# Patient Record
Sex: Female | Born: 1935 | Race: White | Hispanic: No | Marital: Married | Smoking: Never smoker
Health system: Southern US, Community
[De-identification: ages and names within clinical notes are randomized; demographics above are authoritative.]

## PROBLEM LIST (undated history)

## (undated) DIAGNOSIS — E785 Hyperlipidemia, unspecified: Secondary | ICD-10-CM

## (undated) DIAGNOSIS — M8580 Other specified disorders of bone density and structure, unspecified site: Secondary | ICD-10-CM

## (undated) DIAGNOSIS — F330 Major depressive disorder, recurrent, mild: Secondary | ICD-10-CM

## (undated) DIAGNOSIS — R42 Dizziness and giddiness: Secondary | ICD-10-CM

## (undated) DIAGNOSIS — A0472 Enterocolitis due to Clostridium difficile, not specified as recurrent: Secondary | ICD-10-CM

## (undated) DIAGNOSIS — C259 Malignant neoplasm of pancreas, unspecified: Secondary | ICD-10-CM

## (undated) DIAGNOSIS — E039 Hypothyroidism, unspecified: Secondary | ICD-10-CM

## (undated) DIAGNOSIS — I358 Other nonrheumatic aortic valve disorders: Secondary | ICD-10-CM

## (undated) HISTORY — DX: Enterocolitis due to Clostridium difficile, not specified as recurrent: A04.72

## (undated) HISTORY — DX: Malignant neoplasm of pancreas, unspecified: C25.9

## (undated) HISTORY — PX: OTHER SURGICAL HISTORY: SHX169

## (undated) HISTORY — DX: Major depressive disorder, recurrent, mild: F33.0

## (undated) HISTORY — DX: Other nonrheumatic aortic valve disorders: I35.8

## (undated) HISTORY — DX: Hyperlipidemia, unspecified: E78.5

## (undated) HISTORY — DX: Hypothyroidism, unspecified: E03.9

## (undated) HISTORY — DX: Dizziness and giddiness: R42

## (undated) HISTORY — DX: Other specified disorders of bone density and structure, unspecified site: M85.80

---
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
## 1971-02-01 HISTORY — PX: PARTIAL HYSTERECTOMY: SHX80

## 1990-01-31 HISTORY — PX: CHOLECYSTECTOMY: SHX55

## 1997-01-31 HISTORY — PX: BLADDER SUSPENSION: SHX72

## 1997-05-13 ENCOUNTER — Ambulatory Visit (HOSPITAL_COMMUNITY): Admission: RE | Admit: 1997-05-13 | Discharge: 1997-05-13 | Payer: Self-pay | Admitting: *Deleted

## 1998-05-28 ENCOUNTER — Ambulatory Visit (HOSPITAL_COMMUNITY): Admission: RE | Admit: 1998-05-28 | Discharge: 1998-05-28 | Payer: Self-pay | Admitting: *Deleted

## 1999-06-11 ENCOUNTER — Ambulatory Visit (HOSPITAL_COMMUNITY): Admission: RE | Admit: 1999-06-11 | Discharge: 1999-06-11 | Payer: Self-pay | Admitting: *Deleted

## 1999-10-01 ENCOUNTER — Encounter: Admission: RE | Admit: 1999-10-01 | Discharge: 1999-10-01 | Payer: Self-pay | Admitting: *Deleted

## 2000-05-25 ENCOUNTER — Encounter: Admission: RE | Admit: 2000-05-25 | Discharge: 2000-05-25 | Payer: Self-pay | Admitting: *Deleted

## 2000-08-18 ENCOUNTER — Other Ambulatory Visit: Admission: RE | Admit: 2000-08-18 | Discharge: 2000-08-18 | Payer: Self-pay | Admitting: *Deleted

## 2001-04-25 ENCOUNTER — Encounter: Admission: RE | Admit: 2001-04-25 | Discharge: 2001-04-25 | Payer: Self-pay | Admitting: *Deleted

## 2001-09-26 ENCOUNTER — Ambulatory Visit (HOSPITAL_COMMUNITY): Admission: RE | Admit: 2001-09-26 | Discharge: 2001-09-26 | Payer: Self-pay | Admitting: Gastroenterology

## 2001-12-05 ENCOUNTER — Encounter: Admission: RE | Admit: 2001-12-05 | Discharge: 2001-12-05 | Payer: Self-pay | Admitting: *Deleted

## 2002-05-23 ENCOUNTER — Encounter: Admission: RE | Admit: 2002-05-23 | Discharge: 2002-05-23 | Payer: Self-pay | Admitting: *Deleted

## 2003-01-21 ENCOUNTER — Encounter: Admission: RE | Admit: 2003-01-21 | Discharge: 2003-01-21 | Payer: Self-pay | Admitting: Neurological Surgery

## 2003-01-23 ENCOUNTER — Encounter: Admission: RE | Admit: 2003-01-23 | Discharge: 2003-01-23 | Payer: Self-pay | Admitting: Neurological Surgery

## 2003-03-25 ENCOUNTER — Encounter: Admission: RE | Admit: 2003-03-25 | Discharge: 2003-03-25 | Payer: Self-pay | Admitting: *Deleted

## 2004-02-01 HISTORY — PX: WHIPPLE PROCEDURE: SHX2667

## 2004-03-25 ENCOUNTER — Encounter: Admission: RE | Admit: 2004-03-25 | Discharge: 2004-03-25 | Payer: Self-pay | Admitting: *Deleted

## 2004-07-04 ENCOUNTER — Emergency Department (HOSPITAL_COMMUNITY): Admission: EM | Admit: 2004-07-04 | Discharge: 2004-07-04 | Payer: Self-pay | Admitting: Emergency Medicine

## 2004-07-07 ENCOUNTER — Emergency Department (HOSPITAL_COMMUNITY): Admission: EM | Admit: 2004-07-07 | Discharge: 2004-07-07 | Payer: Self-pay | Admitting: Plastic Surgery

## 2004-07-10 ENCOUNTER — Ambulatory Visit (HOSPITAL_COMMUNITY): Admission: RE | Admit: 2004-07-10 | Discharge: 2004-07-10 | Payer: Self-pay | Admitting: *Deleted

## 2004-08-01 ENCOUNTER — Inpatient Hospital Stay (HOSPITAL_COMMUNITY): Admission: EM | Admit: 2004-08-01 | Discharge: 2004-08-03 | Payer: Self-pay | Admitting: Emergency Medicine

## 2004-08-02 ENCOUNTER — Encounter (INDEPENDENT_AMBULATORY_CARE_PROVIDER_SITE_OTHER): Payer: Self-pay | Admitting: *Deleted

## 2005-06-07 ENCOUNTER — Encounter: Admission: RE | Admit: 2005-06-07 | Discharge: 2005-06-07 | Payer: Self-pay | Admitting: *Deleted

## 2005-12-16 ENCOUNTER — Encounter: Admission: RE | Admit: 2005-12-16 | Discharge: 2005-12-16 | Payer: Self-pay | Admitting: Cardiology

## 2006-05-02 ENCOUNTER — Encounter: Admission: RE | Admit: 2006-05-02 | Discharge: 2006-05-02 | Payer: Self-pay | Admitting: Neurological Surgery

## 2006-06-27 ENCOUNTER — Encounter: Admission: RE | Admit: 2006-06-27 | Discharge: 2006-06-27 | Payer: Self-pay | Admitting: Obstetrics and Gynecology

## 2007-06-21 ENCOUNTER — Encounter: Admission: RE | Admit: 2007-06-21 | Discharge: 2007-06-21 | Payer: Self-pay | Admitting: Obstetrics and Gynecology

## 2008-06-12 ENCOUNTER — Encounter: Admission: RE | Admit: 2008-06-12 | Discharge: 2008-06-12 | Payer: Self-pay | Admitting: Family Medicine

## 2008-06-26 ENCOUNTER — Encounter: Admission: RE | Admit: 2008-06-26 | Discharge: 2008-06-26 | Payer: Self-pay | Admitting: Obstetrics and Gynecology

## 2008-07-02 ENCOUNTER — Ambulatory Visit: Payer: Self-pay | Admitting: Oncology

## 2008-09-25 ENCOUNTER — Ambulatory Visit: Payer: Self-pay | Admitting: Oncology

## 2008-11-06 ENCOUNTER — Ambulatory Visit: Payer: Self-pay | Admitting: Oncology

## 2008-12-18 ENCOUNTER — Ambulatory Visit: Payer: Self-pay | Admitting: Oncology

## 2009-01-31 HISTORY — PX: SHOULDER ARTHROSCOPY: SHX128

## 2009-01-31 HISTORY — PX: CATARACT EXTRACTION: SUR2

## 2009-02-02 ENCOUNTER — Ambulatory Visit: Payer: Self-pay | Admitting: Oncology

## 2009-04-14 ENCOUNTER — Encounter: Admission: RE | Admit: 2009-04-14 | Discharge: 2009-04-14 | Payer: Self-pay | Admitting: Neurological Surgery

## 2009-04-27 ENCOUNTER — Ambulatory Visit: Payer: Self-pay | Admitting: Oncology

## 2009-05-04 ENCOUNTER — Ambulatory Visit (HOSPITAL_BASED_OUTPATIENT_CLINIC_OR_DEPARTMENT_OTHER): Admission: RE | Admit: 2009-05-04 | Discharge: 2009-05-04 | Payer: Self-pay | Admitting: Orthopedic Surgery

## 2009-06-10 ENCOUNTER — Ambulatory Visit: Payer: Self-pay | Admitting: Oncology

## 2009-07-13 ENCOUNTER — Encounter: Admission: RE | Admit: 2009-07-13 | Discharge: 2009-07-13 | Payer: Self-pay | Admitting: Internal Medicine

## 2009-08-24 ENCOUNTER — Ambulatory Visit: Payer: Self-pay | Admitting: Oncology

## 2009-10-13 ENCOUNTER — Ambulatory Visit: Payer: Self-pay | Admitting: Oncology

## 2009-11-24 ENCOUNTER — Ambulatory Visit: Payer: Self-pay | Admitting: Oncology

## 2010-01-05 ENCOUNTER — Ambulatory Visit: Payer: Self-pay | Admitting: Oncology

## 2010-02-17 ENCOUNTER — Ambulatory Visit: Payer: Self-pay | Admitting: Oncology

## 2010-04-19 ENCOUNTER — Other Ambulatory Visit: Payer: Self-pay | Admitting: Geriatric Medicine

## 2010-04-19 ENCOUNTER — Other Ambulatory Visit: Payer: Self-pay | Admitting: Family Medicine

## 2010-04-19 DIAGNOSIS — Z1231 Encounter for screening mammogram for malignant neoplasm of breast: Secondary | ICD-10-CM

## 2010-04-19 DIAGNOSIS — M899 Disorder of bone, unspecified: Secondary | ICD-10-CM

## 2010-06-18 NOTE — H&P (Signed)
NAME:  [name], [name] NO.:  [id number]   MEDICAL RECORD NO.:  [id number]           PATIENT TYPE:   LOCATION:                                 FACILITY:   PHYSICIAN:  Petra Kuba, M.D.         DATE OF BIRTH:   DATE OF ADMISSION:  DATE OF DISCHARGE:                                HISTORY & PHYSICAL   HISTORY:  The patient's daughter called me 3 times today to discuss her  case.  Initially, she was going to go to the walk-in clinic.  She had  increasing right upper quadrant abdominal pain radiating to her back.  When  her pain got better, responding to some gas treatment, they thought she  would hold off.  They did not want to go to the emergency room, but then she  called me back with some near syncope and increasing fever, and I again  suggested that they go get checked out, and after multiple conversations on  the phone, we elected to see her in the emergency room, which she finally  agreed upon.  She had been evaluated at [facility] for probably  pancreatic cancer, had an endoscopic ultrasound, ERCP, and stent placement,  and then went to [facility] for a second opinion who thought probably she needed a  second biopsy before proceeding with chemotherapy and radiation prior to her  surgery, which has been suggested to her.   PAST MEDICAL HISTORY:  1.  Pertinent for some thyroid problems.  2.  A history of a partial hysterectomy.  3.  Cholecystectomy.  4.  Some back operations.   FAMILY HISTORY:  Negative for any obvious GI problems, except for her mother  having diverticulosis.   SOCIAL HISTORY:  Drinks socially.  Does not smoke.   ALLERGIES:  1.  PREDNISONE.  2.  LATEX.   REVIEW OF SYSTEMS:  Pertinent for increased fatigue and malaise over the  last few months; however, her yellow jaundice had resolved with a stent.  She has continued to have back pain throughout the years.  Initially, her  current problems have been blamed on her old back  problem.   PHYSICAL EXAMINATION:  GENERAL:  In no acute distress.  Answering questions  appropriately.  No obvious jaundice.  Did have a fever at home.  VITAL SIGNS:  See chart.  Sclerae are anicteric.  NECK:  Supple without obvious adenopathy.  LUNGS:  Clear.  HEART:  Regular rate and rhythm.  ABDOMEN:  Soft, nontender, good bowel sounds.  Cannot duplicate the pain.  No pedal edema.  Good peripheral pulses.   LABORATORIES AND X-RAYS:  Pending at the time of dictation.   ASSESSMENT:  1.  Probable pancreatic cancer status post stent at [facility], now      with increasing pain and fever, possibly stent migraine, stent      occlusion, cholangitis.  2.  History of thyroid problems.  3.  History of hysterectomy, cholecystectomy, and back surgery.   PLAN:  Get stat laboratories and x-rays and blood cultures.  Begin Cipro and  Flagyl.  Try to keep her comfortable with morphine, Phenergan, and Tylenol.  We have discussed ERCP and stent change and the probable diagnosis.  Will  proceed either later today or tomorrow if need be.  The risks, benefits, and  methods were discussed with the family, and they have thanked me for my pain  and quick actions.           ______________________________  Petra Kuba, M.D.     MEM/MEDQ  D:  08/01/2004  T:  08/01/2004  Job:  [id number]   cc:   Bernette Redbird, M.D.  [address]  [place], [place] [address]  Fax: [phone]

## 2010-06-18 NOTE — Op Note (Signed)
   [name], HOLLAR                          ACCOUNT NO.:  [id number]   MEDICAL RECORD NO.:  [id number]                   PATIENT TYPE:  AMB   LOCATION:  ENDO                                 FACILITY:  [facility]   PHYSICIAN:  Florencia Reasons, M.D.             DATE OF BIRTH:  [birth year]   DATE OF PROCEDURE:  09/26/2001  DATE OF DISCHARGE:                                 OPERATIVE REPORT   PROCEDURE:  Colonoscopy.   INDICATIONS FOR PROCEDURE:  Screening for colon cancer in a 77 year old  female.   FINDINGS:  Sigmoid diverticulosis, otherwise, normal to the cecum.   DESCRIPTION OF PROCEDURE:  The nature, purpose and risk of the procedure had  been discussed with the patient who provided written consent. The Olympus  GIFQ-160L adult video colonoscope was easily advanced to the cecum and  pullback was then performed.   There was quite a bit of sigmoid muscular thickening and sigmoid  diverticulosis but this was otherwise a normal examination. No polyps,  cancer, colitis or vascular malformations were noted. The quality of the  prep was very good so it is felt that all areas were adequately seen.  Retroflexion in the rectum was unremarkable as was resinspection of the  rectosigmoid. No biopsies were obtained. The patient tolerated the procedure  well and there were no apparent complications.   IMPRESSION:  Sigmoid diverticulosis, otherwise, normal screening  colonoscopy.   PLAN:  Flexible sigmoidoscopy in five years with perhaps full colonoscopy  again in 10 years as currently allowed by Medicare.                                               Florencia Reasons, M.D.    RVB/MEDQ  D:  09/26/2001  T:  09/28/2001  Job:  [id number]   cc:   Pershing Cox, M.D.  [address]  [address]  [place]  [place] [address]  Fax: [phone]

## 2010-06-18 NOTE — Op Note (Signed)
[name], [name]                 ACCOUNT NO.:  [id number]   MEDICAL RECORD NO.:  [id number]          PATIENT TYPE:  INP   LOCATION:  1315                         FACILITY:  [facility]   PHYSICIAN:  Petra Kuba, M.D.    DATE OF BIRTH:  February 28, 1932   DATE OF PROCEDURE:  08/02/2004  DATE OF DISCHARGE:                                 OPERATIVE REPORT   PROCEDURE:  Endoscopic retrograde cholangiopancreatography, brushings and  stent change.   INDICATIONS:  Patient with probable cholangitis and clogged stent. Consent  was signed after risks, benefits, methods, options were thoroughly discussed  with both the patient and multiple family members on multiple occasions.   MEDICINES USED:  Fentanyl 90 mcg, Versed 10 mg.   DESCRIPTION OF PROCEDURE:  The side-viewing therapeutic video duodenoscope  was inserted by indirect vision into the stomach, advanced through a normal  antrum and pylorus and the periampullary area was brought into view.  The  balloon stent was placed at [facility].  This was far out in the duodenum.  There seemed to be some debris on the end without obvious drainage.  We went  ahead and snared it withdrew it from the scope and sent it for cytology with  instructions to freshen the outside of the stent.  The triple lumen  sphincterotome was then advanced and deep selective cannulation was obtained  fairly readily.  We tried to fill the CBD but could not keep contrast in the  distal duct due to the previous sphincterotomy.  The CBD was slightly  dilated.  The intrahepatic mostly filled the left side which was normal.  We  did not have much right side filling we went ahead and did not overfill the  system.  We went ahead and did some brushings of the distal duct in the  customary fashion over the wire not mentioned above.  Once deep selective  cannulation was obtained using the triple lumen sphincterotome, the Jag wire  was advanced into the intrahepatics.  The brushing was  done over the wire in  the customary fashion and then once depression was done using the Oasis  system, a 10-French 6 cm Tannebaum stent  was advanced into the duct with  excellent biliary drainage.  The introducer was removed. The stent was in  the proper position, and adequate drainage was confirmed both endoscopically  and fluoroscopically.  The scope was removed at this junction.  The patient  tolerated the procedure well.  There was no obvious immediate complication.   ENDOSCOPIC DIAGNOSIS:  1.  Stent probably clotted with debris moderately far in the duodenum status      post snared and sent for pathology.  2.  Normal ampulla.  3.  No PD injections.  4.  Distal duct not well seen due to previous sphincterotomy status post      brushing.  5.  Dilated slightly common bile duct status post stent change with a 10-      French 6 cm Tannebaum stent with adequate drainage post procedure.   PLAN:  1.  Observe for delayed  complications, if none possibly home tomorrow and      await pathology as above.  2.  Final decision per [facility] doctors and Dr. Marilynn Rail to decide on surgery      versus chemoradiation first.       MEM/MEDQ  D:  08/02/2004  T:  08/03/2004  Job:  [id number]   cc:   Bernette Redbird, M.D.  [address]  [place], [place] [address]  Fax: [phone]   Monico Hoar, M.D.

## 2010-06-18 NOTE — Discharge Summary (Signed)
NAME:  [name], [name]                 ACCOUNT NO.:  [id number]   MEDICAL RECORD NO.:  [id number]          PATIENT TYPE:  INP   LOCATION:  1315                         FACILITY:  [facility]   PHYSICIAN:  Petra Kuba, M.D.    DATE OF BIRTH:  [birth year]   DATE OF ADMISSION:  08/01/2004  DATE OF DISCHARGE:                                 DISCHARGE SUMMARY   HISTORY:  The patient was admitted with probable cholangitis from a clogged  stent, treated with IV fluids and antibiotics Cipro and Flagyl, and  underwent an ERCP with repeat brushing and stent change. She was admitted on  August 01, 2004 in the late afternoon. The procedure was done on July 3 without  complications or problems. She did seem to have a clotted stent, and the  stent and brushings were sent for cytology. She was observed overnight. Her  symptoms improved significantly with the antibiotics and the procedure, and  was deemed ready for discharge on July 4.   DISCHARGE DIAGNOSES:  1.  Cholangitis status post stent change.  2.  Probable pancreatic cancer.  3.  History of thyroid problems.  4.  History of partial hysterectomy, cholecystectomy, and back problems.   CONDITION ON DISCHARGE:  Improved.   DISCHARGE INSTRUCTIONS:  1.  Resume home Synthroid.  2.  Phenergan and Tylenol as needed. I talked her into trying some Ultram if      her pain is not relieved by Tylenol.  3.  Will continue Cipro 500 b.i.d. for 10 days.  4.  Pain  management as was discussed above.  5.  Activity:  Slowly advance as tolerated.  6.  Diet:  Slowly advance as well, as we discussed.  7.  Wound care:  Not applicable.  8.  Special instructions:  Call if increased pain, fever, jaundice, signs of      bleeding, nausea, vomiting, or other questions or problems. I have asked      her to call me Friday if she does not hear from [place] regarding her      pathology and her cultures.   As an aside, not mentioned above, her gram stain was positive for gram  negative rods after 24 hours of her drawing the blood cultures. Also as an  aside, her white count was 12 on admission, came down to 8 during her  hospitalization, hemoglobin was stable. Her liver tests decreased  significantly with a bili of 2.4, alk phos 218, SGPT 295, and SGOT 363.  Decreased significantly with the stent. Coags, amylase and lipase were all  normal, as was BUN and creatinine.   CONDITION ON DISCHARGE:  Improved.   Further follow-up plan will be waiting on the family's decision regarding  surgical options and will be on standby to help p.r.n.       MEM/MEDQ  D:  08/03/2004  T:  08/03/2004  Job:  [id number]   cc:   Bernette Redbird, M.D.  [address] [place]  [place], [place] [address]  Fax: [phone]   Alexia Freestone, M.D.  [address]  [place]  [place] [address]  Fax: [phone]   [address] [facility] Dr. Joselyn Glassman

## 2010-06-29 ENCOUNTER — Other Ambulatory Visit: Payer: Self-pay

## 2010-07-15 ENCOUNTER — Other Ambulatory Visit: Payer: Self-pay

## 2010-07-15 ENCOUNTER — Ambulatory Visit: Payer: Self-pay

## 2010-07-19 ENCOUNTER — Ambulatory Visit: Payer: Self-pay

## 2010-07-19 ENCOUNTER — Other Ambulatory Visit: Payer: Self-pay

## 2010-08-03 ENCOUNTER — Ambulatory Visit
Admission: RE | Admit: 2010-08-03 | Discharge: 2010-08-03 | Disposition: A | Discharge status: Home / Self Care | Payer: Medicare Other | Source: Ambulatory Visit | Attending: Geriatric Medicine | Admitting: Geriatric Medicine

## 2010-08-03 ENCOUNTER — Ambulatory Visit
Admission: RE | Admit: 2010-08-03 | Discharge: 2010-08-03 | Disposition: A | Discharge status: Home / Self Care | Payer: Medicare Other | Source: Ambulatory Visit | Attending: Family Medicine | Admitting: Family Medicine

## 2010-08-03 DIAGNOSIS — M949 Disorder of cartilage, unspecified: Secondary | ICD-10-CM

## 2010-08-03 DIAGNOSIS — M899 Disorder of bone, unspecified: Secondary | ICD-10-CM

## 2010-08-03 DIAGNOSIS — Z1231 Encounter for screening mammogram for malignant neoplasm of breast: Secondary | ICD-10-CM

## 2010-09-17 ENCOUNTER — Other Ambulatory Visit: Payer: Self-pay | Admitting: Neurological Surgery

## 2010-09-17 DIAGNOSIS — M545 Low back pain, unspecified: Secondary | ICD-10-CM

## 2010-09-17 DIAGNOSIS — M47812 Spondylosis without myelopathy or radiculopathy, cervical region: Secondary | ICD-10-CM

## 2010-09-21 ENCOUNTER — Ambulatory Visit
Admission: RE | Admit: 2010-09-21 | Discharge: 2010-09-21 | Disposition: A | Discharge status: Home / Self Care | Payer: Medicare Other | Source: Ambulatory Visit | Attending: Neurological Surgery | Admitting: Neurological Surgery

## 2010-09-21 DIAGNOSIS — M545 Low back pain, unspecified: Secondary | ICD-10-CM

## 2010-09-21 DIAGNOSIS — M47812 Spondylosis without myelopathy or radiculopathy, cervical region: Secondary | ICD-10-CM

## 2010-09-30 ENCOUNTER — Other Ambulatory Visit: Payer: Self-pay | Admitting: Neurological Surgery

## 2010-09-30 DIAGNOSIS — M25552 Pain in left hip: Secondary | ICD-10-CM

## 2010-10-03 ENCOUNTER — Ambulatory Visit
Admission: RE | Admit: 2010-10-03 | Discharge: 2010-10-03 | Disposition: A | Discharge status: Home / Self Care | Payer: Medicare Other | Source: Ambulatory Visit | Attending: Neurological Surgery | Admitting: Neurological Surgery

## 2010-10-03 DIAGNOSIS — M25552 Pain in left hip: Secondary | ICD-10-CM

## 2010-10-21 ENCOUNTER — Observation Stay (HOSPITAL_COMMUNITY)
Admission: EM | Admit: 2010-10-21 | Discharge: 2010-10-22 | Disposition: A | Discharge status: Home / Self Care | Payer: Medicare Other | Attending: Internal Medicine | Admitting: Internal Medicine

## 2010-10-21 DIAGNOSIS — R197 Diarrhea, unspecified: Secondary | ICD-10-CM | POA: Insufficient documentation

## 2010-10-21 DIAGNOSIS — R82998 Other abnormal findings in urine: Secondary | ICD-10-CM | POA: Insufficient documentation

## 2010-10-21 DIAGNOSIS — Z66 Do not resuscitate: Secondary | ICD-10-CM | POA: Insufficient documentation

## 2010-10-21 DIAGNOSIS — Z8509 Personal history of malignant neoplasm of other digestive organs: Secondary | ICD-10-CM | POA: Insufficient documentation

## 2010-10-21 DIAGNOSIS — A0472 Enterocolitis due to Clostridium difficile, not specified as recurrent: Principal | ICD-10-CM | POA: Insufficient documentation

## 2010-10-21 DIAGNOSIS — E876 Hypokalemia: Secondary | ICD-10-CM | POA: Insufficient documentation

## 2010-10-21 DIAGNOSIS — E039 Hypothyroidism, unspecified: Secondary | ICD-10-CM | POA: Insufficient documentation

## 2010-10-21 DIAGNOSIS — E860 Dehydration: Secondary | ICD-10-CM | POA: Insufficient documentation

## 2010-10-21 LAB — DIFFERENTIAL
Basophils Absolute: 0.1 10*3/uL (ref 0.0–0.1)
Basophils Relative: 1 % (ref 0–1)
Eosinophils Absolute: 0.2 10*3/uL (ref 0.0–0.7)
Eosinophils Relative: 3 % (ref 0–5)
Lymphocytes Relative: 25 % (ref 12–46)
Lymphs Abs: 1.8 10*3/uL (ref 0.7–4.0)
Monocytes Absolute: 0.4 10*3/uL (ref 0.1–1.0)
Monocytes Relative: 6 % (ref 3–12)
Neutro Abs: 4.7 10*3/uL (ref 1.7–7.7)
Neutrophils Relative %: 65 % (ref 43–77)

## 2010-10-21 LAB — COMPREHENSIVE METABOLIC PANEL
ALT: 14 U/L (ref 0–35)
AST: 22 U/L (ref 0–37)
Albumin: 3.9 g/dL (ref 3.5–5.2)
Alkaline Phosphatase: 83 U/L (ref 39–117)
BUN: 9 mg/dL (ref 6–23)
CO2: 22 mEq/L (ref 19–32)
Calcium: 9.3 mg/dL (ref 8.4–10.5)
Chloride: 104 mEq/L (ref 96–112)
Creatinine, Ser: 0.81 mg/dL (ref 0.50–1.10)
GFR calc Af Amer: >60 mL/min (ref >60)
GFR calc non Af Amer: >60 mL/min (ref >60)
Glucose, Bld: 90 mg/dL (ref 70–99)
Potassium: 3.2 mEq/L — ABNORMAL LOW (ref 3.5–5.1)
Sodium: 138 mEq/L (ref 135–145)
Total Bilirubin: 0.3 mg/dL (ref 0.3–1.2)
Total Protein: 7.5 g/dL (ref 6.0–8.3)

## 2010-10-21 LAB — LIPASE, BLOOD: Lipase: 7 U/L — ABNORMAL LOW (ref 11–59)

## 2010-10-21 LAB — URINE MICROSCOPIC-ADD ON

## 2010-10-21 LAB — URINALYSIS, ROUTINE W REFLEX MICROSCOPIC
Bilirubin Urine: NEGATIVE
Glucose, UA: NEGATIVE mg/dL
Ketones, ur: 15 mg/dL — AB
Nitrite: POSITIVE — AB
Protein, ur: NEGATIVE mg/dL
Specific Gravity, Urine: 1.024 (ref 1.005–1.030)
Urobilinogen, UA: 0.2 mg/dL (ref 0.0–1.0)
pH: 5.5 (ref 5.0–8.0)

## 2010-10-21 LAB — CBC
HCT: 41.2 % (ref 36.0–46.0)
Hemoglobin: 13.8 g/dL (ref 12.0–15.0)
MCH: 30.3 pg (ref 26.0–34.0)
MCHC: 33.5 g/dL (ref 30.0–36.0)
MCV: 90.5 fL (ref 78.0–100.0)
Platelets: 336 10*3/uL (ref 150–400)
RBC: 4.55 MIL/uL (ref 3.87–5.11)
RDW: 13.1 % (ref 11.5–15.5)
WBC: 7.1 10*3/uL (ref 4.0–10.5)

## 2010-10-22 LAB — POTASSIUM: Potassium: 3.3 mEq/L — ABNORMAL LOW (ref 3.5–5.1)

## 2010-11-01 NOTE — Discharge Summary (Signed)
[name], [name]                 ACCOUNT NO.:  [id number]  MEDICAL RECORD NO.:  [id number]  LOCATION:  1332                         FACILITY:  [facility]  PHYSICIAN:  Thad Ranger, MD       DATE OF BIRTH:  May 17, 1932  DATE OF ADMISSION:  10/21/2010 DATE OF DISCHARGE:                        DISCHARGE SUMMARY - REFERRING   PRIMARY CARE PHYSICIAN:  Hal T. Stoneking, MD  DISCHARGE DIAGNOSES: 1. Clostridium difficile diarrhea/Clostridium difficile colitis. 2. Dehydration, resolved. 3. Asymptomatic bacteriuria. 4. Hypothyroidism. 5. Recent ventral hernia repair on October 12, 2010. 6. Hypokalemia.  DISCHARGE MEDICATIONS: 1. Florastor 500 mg p.o. b.i.d. for 15 days. 2. Flagyl 500 mg p.o. t.i.d. for 14 days. 3. Cholestyramine 1 packet p.o. daily as needed for any diarrhea. 4. Synthroid 50 mcg p.o. daily a.m. 5. Vitamin D2 50,000 units twice a week.  BRIEF HISTORY OF PRESENT ILLNESS:  At the time of admission, [name] is a 77 year old female with history of pancreatic cancer status post therapy and Whipple surgery, recent ventral hernia repair on September 11th, presented for evaluation of C difficile colitis.  The patient underwent ventral hernia repair from hernia that was secondary to her Whipple surgery.  She reports that the surgery went well without any complications.  She was discharged home after she did well, however, in the past couple of days, she had a new onset of diarrhea which she described as watery, foul smelling.  No problems with any fever or chills.  No melena or hematochezia.  She was having decreased p.o. intake secondary to distinct smell that she has been experiencing with the diarrhea.  The patient saw her PCP, Dr. Pete Ellis. 2 days ago who evaluated and diagnosed her with C difficile colitis.  The patient was prescribed regimen of Flagyl 250 p.o. t.i.d. The patient reported that the pain and the watery diarrhea continued.  At this time, she decided to  call her gastroenterologist who told her to double up on the regimen to 500 t.i.d.  The patient, however, took only 1 dose and decided to come to the emergency room. In the emergency room, the patient's blood pressure was 136/58, heart rate 70, respiration rate 20, saturating 99% on room air.  The patient reported upon admission that her diarrhea had nearly resolved.  The patient was given a couple of liters of IV fluid and was admitted for observation.  RADIOLOGICAL DATA:  None.  CBC at the time of admission and CMP was unremarkable except potassium of 3.2.  BRIEF HOSPITALIZATION COURSE: 1. C difficile diarrhea/colitis, improving.  The patient was placed on     500 mg p.o. t.i.d. of Flagyl and received multiple liters of IV     fluids.  She is currently tolerating diet and ambulating in the     hallway.  The patient's symptoms have been improving and states     that the diarrhea fairly resolved and she is now having forming     stools. 2. Hypokalemia.  This was replaced.  We will recheck the potassium     prior to the discharge.  The patient will be discharged home today     as she is doing well and  ambulating in the hallway, tolerating her     diet, and per the patient the diarrhea is improving. 3. She was not placed on any antibiotics for the asymptomatic pyuria.     UA had shown positive nitrites and small leukocytes with few     bacteria and WBCs 0-2, given the C difficile colitis.  PHYSICAL EXAMINATION:  VITAL SIGNS:  At the time of discharge, blood pressure 150/68, temperature 98.1, pulse 63, respirations 16. GENERAL:  The patient is alert, awake, and oriented, very pleasant and cooperative. CVS:  S1 and S2 clear. CHEST:  Clear to auscultation. ABDOMEN:  Soft, nontender, nondistended.  Normal bowel sounds. EXTREMITIES:  No cyanosis, clubbing, or edema noted in upper or lower extremities bilaterally.  Discharge follow up with Dr. Merlene Ellis within next 7 days.   The patient was also counseled strongly to return back to the emergency room if she has worsening diarrhea, fever, chills, or abdominal distention.  DISCHARGE TIME:  35 minutes.     Thad Ranger, MD     RR/MEDQ  D:  10/22/2010  T:  10/22/2010  Job:  [id number]  cc:   Hal T. Stoneking, M.D. Fax: [phone]  Electronically Signed by RIPUDEEP RAI  on 11/01/2010 03:19:11 PM

## 2010-11-03 NOTE — H&P (Signed)
[name], [name] NO.:  [id number]  MEDICAL RECORD NO.:  [id number]  LOCATION:  1332                         FACILITY:  [facility]  PHYSICIAN:  Suella Grove, MD          DATE OF BIRTH:  1932-05-02  DATE OF ADMISSION:  10/21/2010 DATE OF DISCHARGE:                             HISTORY & PHYSICAL   PRIMARY CARE PHYSICIAN:  Hal T. Stoneking, M.D.  CHIEF COMPLAINT:  C. diff colitis.  HISTORY OF PRESENT ILLNESS:  The patient is a 77 year old woman, with history of pancreatic cancer status post therapy and Whipple surgery, recent ventral hernia repair on October 08, 2010, who presents for evaluation of C. diff colitis.  The patient underwent a ventral hernia repair from a hernia that was secondary to her Whipple surgery.  She reports that the surgery went well without complications.  Was discharged home where she reports that initially she did well.  However, in the past couple of days, she reports that she had a new onset diarrhea which she describes as a watery and foul-smelling diarrhea.  No problems with any fevers or chills.  No melena or hematochezia.  Does report though that she has been having decreased p.o. intake secondary to the distinct smell that she has been experiencing with the diarrhea. She saw her PCP 2 days ago, who evaluated and diagnosed her with C. diff colitis.  He prescribed her a regimen of Flagyl 250 mg p.o. q.8 hours which the patient reports that she had taken.  However, she reported that the pain and watery diarrhea continued.  At that time, she decided to call her gastroenterologist, who told her to double up on the regimen to equal 500 mg q.8 hours.  She reports that she had taken one dose and decided to come into the emergency room after her PCP who had originally prescribed medication told her that given her dehydration and continued diarrhea that she should be admitted for IV fluids and further monitoring.  In the emergency room,  temperature is 98.1, blood pressure 136/58, heart rate 70, respirations 20, satting 99% on room air.  The patient reports that upon admission, her diarrhea had nearly resolved.  She was given a couple liters of IV fluid and decided to be admitted for further evaluation given recent changes.  PAST MEDICAL HISTORY: 1. Includes but is not completely defined by pancreatic cancer, status     post gene therapy and Whipple procedure. 2. Hypothyroidism.  PAST SURGICAL HISTORY:  Prior Whipple procedure, recent ventral hernia repair on October 08, 2010.  She does report though she had a ventral hernia repair in the past as well.  ALLERGIES: 1. LATEX. 2. HYDROMORPHONE. 3. OXYCODONE. 4. PENICILLIN. 5. PHENERGAN.  MEDICATIONS: 1. Flagyl 150 mg p.o. q.8 hours. 2. Synthroid.  FAMILY HISTORY:  Patient reports no history of malignancy in the family.  SOCIAL HISTORY:  The patient is married and lives with her husband. Denies any tobacco, ethanol, or illicits.  REVIEW OF SYSTEMS:  GENERAL:  As per HPI.  HEENT:  No visual changes or changes in hearing.  RESPIRATORY:  No coughing or wheezing. CARDIOVASCULAR:  No chest pain,  shortness of breath, dyspnea on exertion, palpitations, paroxysmal nocturnal dyspnea, or orthopnea. GASTROINTESTINAL:  As per HPI.  GENITOURINARY:  No dysuria, hematuria, or increased frequency or urgency.  MUSCULOSKELETAL:  No muscle weakness or fatigue.  No joint pain.  ENDOCRINE:  No polyuria or polydipsia.  No heat or cold intolerance.  HEMATOLOGIC:  No easy bruising or bleeding. NEUROLOGICAL:  No headache, lightheadedness, or dizziness.  No history of CVA.  PSYCHIATRIC:  No depression or anxiety.  No suicidal or homicidal ideation.  PHYSICAL EXAMINATION:  VITALS:  As above. GENERAL:  Alert and oriented x3, no apparent distress. HEENT:  Sclerae anicteric, pupils are equal and reactive to light and accommodation, extraocular motions are intact, oropharynx is mildly  dry, but otherwise grossly normal. NECK:  Soft.  No lymphadenopathy or thyromegaly. LUNGS:  Clear to auscultation bilaterally.  No wheezes, rales, or rhonchi. HEART:  Regular rate and rhythm with normal S1 and S2.  No rubs, gallops, or murmurs. ABDOMEN:  Soft, minimally tender to palpation diffusely.  Positive bowel sounds.  Surgical incision site in the midline upper abdomen, well healed with no evidence for erythema, induration or swelling. EXTREMITIES:  No cyanosis, clubbing, or edema.  2+ dorsalis pedis and radial pulses bilaterally. NEUROLOGIC:  Grossly intact.  LABORATORY DATA:  CBC:  White count 7.1, hemoglobin 13.8, platelet count 336.  Chemistry:  Sodium 138, potassium 3.2, chloride 104, bicarb 22, BUN 9, creatinine 0.81, glucose 90.  LFT:  AST 22, ALT 14, alk phos 83, T bili 0.3, albumin 3.9, total protein 7.5.  Urinalysis:  Positive ketones, moderate blood, positive nitrites with small amount of the leukocyte esterase.  IMPRESSION/PLAN: 1. Clostridium difficile colitis:  We will increase the regimen from     250 mg p.o. q.8 to 500 mg p.o. q.8.  Continue to monitor. 2. Poor oral intake:  The patient has received multiple liters of     fluid in the emergency room.  We will continue to provide     maintenance fluids and monitor the patient's diet.  Continue to     monitor. 3. Abnormal urinalysis.  Suspect that this is secondary to      the patient's dehydration.  We will     consider repeating it during the inpatient evaluation if patient     becomes symptomatic. 4. Code status:  Patient is do not resuscitate.  Her decision maker is     her husband whose name is, Tomeka Kantner, and his phone number is [phone].          ______________________________ Suella Grove, MD     AC/MEDQ  D:  10/21/2010  T:  10/22/2010  Job:  [id number]  Electronically Signed by Suella Grove MD on 11/03/2010 05:56:14 PM

## 2010-12-31 ENCOUNTER — Other Ambulatory Visit: Payer: Self-pay | Admitting: Sports Medicine

## 2010-12-31 DIAGNOSIS — M25572 Pain in left ankle and joints of left foot: Secondary | ICD-10-CM

## 2011-01-04 ENCOUNTER — Other Ambulatory Visit: Payer: Medicare Other

## 2011-01-05 ENCOUNTER — Ambulatory Visit
Admission: RE | Admit: 2011-01-05 | Discharge: 2011-01-05 | Disposition: A | Discharge status: Home / Self Care | Payer: Medicare Other | Source: Ambulatory Visit | Attending: Sports Medicine | Admitting: Sports Medicine

## 2011-01-05 DIAGNOSIS — M25572 Pain in left ankle and joints of left foot: Secondary | ICD-10-CM

## 2011-02-08 DIAGNOSIS — M19079 Primary osteoarthritis, unspecified ankle and foot: Secondary | ICD-10-CM | POA: Diagnosis not present

## 2011-02-16 DIAGNOSIS — R319 Hematuria, unspecified: Secondary | ICD-10-CM | POA: Diagnosis not present

## 2011-02-16 DIAGNOSIS — B9789 Other viral agents as the cause of diseases classified elsewhere: Secondary | ICD-10-CM | POA: Diagnosis not present

## 2011-02-16 DIAGNOSIS — R52 Pain, unspecified: Secondary | ICD-10-CM | POA: Diagnosis not present

## 2011-04-01 DIAGNOSIS — R5383 Other fatigue: Secondary | ICD-10-CM | POA: Diagnosis not present

## 2011-04-01 DIAGNOSIS — R0602 Shortness of breath: Secondary | ICD-10-CM | POA: Diagnosis not present

## 2011-04-01 DIAGNOSIS — R5381 Other malaise: Secondary | ICD-10-CM | POA: Diagnosis not present

## 2011-04-06 DIAGNOSIS — R319 Hematuria, unspecified: Secondary | ICD-10-CM | POA: Diagnosis not present

## 2011-04-06 DIAGNOSIS — K8689 Other specified diseases of pancreas: Secondary | ICD-10-CM | POA: Diagnosis not present

## 2011-04-06 DIAGNOSIS — M6281 Muscle weakness (generalized): Secondary | ICD-10-CM | POA: Diagnosis not present

## 2011-04-21 DIAGNOSIS — H8110 Benign paroxysmal vertigo, unspecified ear: Secondary | ICD-10-CM | POA: Diagnosis not present

## 2011-04-21 DIAGNOSIS — R5383 Other fatigue: Secondary | ICD-10-CM | POA: Diagnosis not present

## 2011-04-21 DIAGNOSIS — R079 Chest pain, unspecified: Secondary | ICD-10-CM | POA: Diagnosis not present

## 2011-04-21 DIAGNOSIS — R5381 Other malaise: Secondary | ICD-10-CM | POA: Diagnosis not present

## 2011-04-26 ENCOUNTER — Ambulatory Visit: Payer: Medicare Other | Attending: Geriatric Medicine | Admitting: Physical Therapy

## 2011-04-26 DIAGNOSIS — R42 Dizziness and giddiness: Secondary | ICD-10-CM | POA: Diagnosis not present

## 2011-04-26 DIAGNOSIS — IMO0001 Reserved for inherently not codable concepts without codable children: Secondary | ICD-10-CM | POA: Diagnosis not present

## 2011-04-26 DIAGNOSIS — R269 Unspecified abnormalities of gait and mobility: Secondary | ICD-10-CM | POA: Insufficient documentation

## 2011-04-27 ENCOUNTER — Encounter: Payer: Medicare Other | Admitting: Physical Therapy

## 2011-05-03 ENCOUNTER — Ambulatory Visit: Payer: Medicare Other | Attending: Geriatric Medicine | Admitting: Physical Therapy

## 2011-05-03 DIAGNOSIS — IMO0001 Reserved for inherently not codable concepts without codable children: Secondary | ICD-10-CM | POA: Insufficient documentation

## 2011-05-03 DIAGNOSIS — R269 Unspecified abnormalities of gait and mobility: Secondary | ICD-10-CM | POA: Insufficient documentation

## 2011-05-03 DIAGNOSIS — R42 Dizziness and giddiness: Secondary | ICD-10-CM | POA: Insufficient documentation

## 2011-05-04 ENCOUNTER — Encounter: Payer: Self-pay | Admitting: Neurology

## 2011-05-05 ENCOUNTER — Encounter: Payer: Medicare Other | Admitting: Physical Therapy

## 2011-05-05 DIAGNOSIS — H04129 Dry eye syndrome of unspecified lacrimal gland: Secondary | ICD-10-CM | POA: Diagnosis not present

## 2011-05-10 ENCOUNTER — Encounter: Payer: Medicare Other | Admitting: Physical Therapy

## 2011-05-11 ENCOUNTER — Other Ambulatory Visit: Payer: Self-pay | Admitting: Obstetrics and Gynecology

## 2011-05-11 DIAGNOSIS — N6459 Other signs and symptoms in breast: Secondary | ICD-10-CM

## 2011-05-11 DIAGNOSIS — Z124 Encounter for screening for malignant neoplasm of cervix: Secondary | ICD-10-CM | POA: Diagnosis not present

## 2011-05-11 DIAGNOSIS — Z01419 Encounter for gynecological examination (general) (routine) without abnormal findings: Secondary | ICD-10-CM | POA: Diagnosis not present

## 2011-05-11 DIAGNOSIS — N644 Mastodynia: Secondary | ICD-10-CM

## 2011-05-12 ENCOUNTER — Encounter: Payer: Medicare Other | Admitting: Physical Therapy

## 2011-05-13 ENCOUNTER — Ambulatory Visit
Admission: RE | Admit: 2011-05-13 | Discharge: 2011-05-13 | Disposition: A | Discharge status: Home / Self Care | Payer: Medicare Other | Source: Ambulatory Visit | Attending: Obstetrics and Gynecology | Admitting: Obstetrics and Gynecology

## 2011-05-13 ENCOUNTER — Other Ambulatory Visit: Payer: Self-pay | Admitting: Obstetrics and Gynecology

## 2011-05-13 DIAGNOSIS — N644 Mastodynia: Secondary | ICD-10-CM

## 2011-05-13 DIAGNOSIS — N6459 Other signs and symptoms in breast: Secondary | ICD-10-CM

## 2011-05-16 ENCOUNTER — Encounter: Payer: Medicare Other | Admitting: Physical Therapy

## 2011-05-19 ENCOUNTER — Encounter: Payer: Medicare Other | Admitting: Physical Therapy

## 2011-05-24 ENCOUNTER — Encounter: Payer: Medicare Other | Admitting: Physical Therapy

## 2011-05-26 ENCOUNTER — Encounter: Payer: Medicare Other | Admitting: Physical Therapy

## 2011-05-27 ENCOUNTER — Encounter: Payer: Self-pay | Admitting: Neurology

## 2011-05-27 ENCOUNTER — Ambulatory Visit (INDEPENDENT_AMBULATORY_CARE_PROVIDER_SITE_OTHER): Payer: Medicare Other | Admitting: Neurology

## 2011-05-27 ENCOUNTER — Other Ambulatory Visit (INDEPENDENT_AMBULATORY_CARE_PROVIDER_SITE_OTHER): Payer: Medicare Other

## 2011-05-27 ENCOUNTER — Other Ambulatory Visit: Payer: Self-pay | Admitting: Neurology

## 2011-05-27 VITALS — BP 112/70 | HR 76 | Wt 154.0 lb

## 2011-05-27 DIAGNOSIS — R2689 Other abnormalities of gait and mobility: Secondary | ICD-10-CM

## 2011-05-27 DIAGNOSIS — R7309 Other abnormal glucose: Secondary | ICD-10-CM | POA: Diagnosis not present

## 2011-05-27 DIAGNOSIS — R29818 Other symptoms and signs involving the nervous system: Secondary | ICD-10-CM | POA: Diagnosis not present

## 2011-05-27 DIAGNOSIS — G609 Hereditary and idiopathic neuropathy, unspecified: Secondary | ICD-10-CM | POA: Diagnosis not present

## 2011-05-27 LAB — CBC WITH DIFFERENTIAL/PLATELET
Basophils Absolute: 0 10*3/uL (ref 0.0–0.1)
Basophils Relative: 0.5 % (ref 0.0–3.0)
Eosinophils Absolute: 0.2 10*3/uL (ref 0.0–0.7)
Eosinophils Relative: 2.7 % (ref 0.0–5.0)
HCT: 40.6 % (ref 36.0–46.0)
Hemoglobin: 13.6 g/dL (ref 12.0–15.0)
Lymphocytes Relative: 27.3 % (ref 12.0–46.0)
Lymphs Abs: 1.5 10*3/uL (ref 0.7–4.0)
MCHC: 33.5 g/dL (ref 30.0–36.0)
MCV: 90.2 fl (ref 78.0–100.0)
Monocytes Absolute: 0.4 10*3/uL (ref 0.1–1.0)
Monocytes Relative: 7.7 % (ref 3.0–12.0)
Neutro Abs: 3.5 10*3/uL (ref 1.4–7.7)
Neutrophils Relative %: 61.8 % (ref 43.0–77.0)
Platelets: 250 10*3/uL (ref 150.0–400.0)
RBC: 4.5 Mil/uL (ref 3.87–5.11)
RDW: 14.5 % (ref 11.5–14.6)
WBC: 5.7 10*3/uL (ref 4.5–10.5)

## 2011-05-27 LAB — COMPREHENSIVE METABOLIC PANEL
ALT: 21 U/L (ref 0–35)
AST: 28 U/L (ref 0–37)
Albumin: 4.3 g/dL (ref 3.5–5.2)
Alkaline Phosphatase: 73 U/L (ref 39–117)
BUN: 16 mg/dL (ref 6–23)
CO2: 26 mEq/L (ref 19–32)
Calcium: 9.8 mg/dL (ref 8.4–10.5)
Chloride: 107 mEq/L (ref 96–112)
Creatinine, Ser: 0.8 mg/dL (ref 0.4–1.2)
GFR: 71.93 mL/min (ref >60.00)
Glucose, Bld: 85 mg/dL (ref 70–99)
Potassium: 3.8 mEq/L (ref 3.5–5.1)
Sodium: 142 mEq/L (ref 135–145)
Total Bilirubin: 0.3 mg/dL (ref 0.3–1.2)
Total Protein: 7.8 g/dL (ref 6.0–8.3)

## 2011-05-27 LAB — SEDIMENTATION RATE: Sed Rate: 29 mm/hr — ABNORMAL HIGH (ref 0–22)

## 2011-05-27 LAB — TSH: TSH: 1.49 u[IU]/mL (ref 0.35–5.50)

## 2011-05-27 LAB — HEMOGLOBIN A1C: Hgb A1c MFr Bld: 6.2 % (ref 4.6–6.5)

## 2011-05-27 LAB — VITAMIN B12: Vitamin B-12: 481 pg/mL (ref 211–911)

## 2011-05-27 NOTE — Patient Instructions (Addendum)
Go to the basement to have your labs drawn today.  Your MRI is scheduled for Wednesday, May 1st at 6:45pm.  Please arrive to [facility] by 6:15pm.  [address] [phone].  Your appointment for the nerve conduction studies/electromyelogram is scheduled for Monday, June 3rd 9:00am at [facility] [address] [place], [place] [phone].

## 2011-05-27 NOTE — Progress Notes (Signed)
Dear Dr. Pete Glatter,  Thank you for having me see [name] in consultation today at [facility] for her problem with a feeling of imbalance as well as a long term unsteadiness on her feet..  As you may recall, she is a 78 y.o. year old female with a history of a remote L4-L5 left hemilaminectomy as well as pancreatic cancer s/p Whipple procedure and experimental gene therapy as well as local radiation and chemotherapy who has developed a difficulty with a feeling of imbalance only when standing.  This is to be differentiated to the long term instability she feels on her feet.  The feeling of imbalance started as a feeling of "dizziness" concomitant to having problems with nasal congestion in in January and February.  This evolved to a feeling of imbalance that was worse with movement.  It also presents as a feeling of spontaneous movement.  It is only detected with being on her feet.  This feeling of moving "to and fro" or "rocking and realing motion" can not be brought on by quickly turning over in bed or turning her head in the car.  She has long term tinnitus as well as decreased hearing.  It is unclear whether this at all correlates with her recently feeling of imbalance.  As mentioned she has long term unsteadiness on her feet.  However, she differentiates this by saying that this is a problem in her "legs" and not in her head.  She has not had any falls.  She nasal congestion continues to some degree.  She also feels weak in her legs, particularly on the left.  She denies numbness in her feet.   She was seen in vestibular rehab and they reportedly felt that her problem was central in origin. Apparently the vestibular exercises made her worse.    She has a long history of neck pain.  A recent C-spine MRI did reveal some encroachment of the spinal cord particularly at C5-C6 but not significant stenosis.   A recent MRI L-spine did show some mild stenosis of the lateral recesses at L3-L4  bilaterally.  The rest of the spine did not show any obvious neural compression.  Past Medical History  Diagnosis Date  . Pancreatic cancer   . Hypothyroid   . Vitamin d deficiency     Past Surgical History  Procedure Date  . Cataract extraction 2011    bilateral  . Whipple procedure 2006  . Multiple hernias 2007, 2012    ventral  . Tonsillectomy 1942  . Partial hysterectomy 1973  . Bladder suspension 1999  . Shoulder arthroscopy 2011    rt  . Cholecystectomy 1992  . Lower back surgery 1994, 1995    L4-5    History   Social History  . Marital Status: Married    Spouse Name: N/A    Number of Children: N/A  . Years of Education: N/A   Social History Main Topics  . Smoking status: Never Smoker   . Smokeless tobacco: Never Used  . Alcohol Use: Yes  . Drug Use: None  . Sexually Active: None   Other Topics Concern  . None   Social History Narrative  . None    No family history on file.  Current Outpatient Prescriptions on File Prior to Visit  Medication Sig Dispense Refill  . levothyroxine (SYNTHROID, LEVOTHROID) 50 MCG tablet Take 50 mcg by mouth daily.      . methscopolamine (PAMINE FORTE) 5 MG tablet Take 5  mg by mouth. As needed      . pantoprazole (PROTONIX) 40 MG tablet Take 40 mg by mouth daily.        Allergies  Allergen Reactions  . Amoxicillin   . Delsym   . Gastrografin   . Hydrocodone   . Latex   . Oxycodone   . Phenergan   . Prednisone   . Robaxin   . Tape       ROS:  13 systems were reviewed and are notable for hearing loss, eye pain, ringing in ears, nasal congestion, sinus problems, leg pain while walking, incontinence.  All other review of systems are unremarkable.   Examination:  Filed Vitals:   05/27/11 1317 05/27/11 1349 05/27/11 1350 05/27/11 1351  BP: 130/76 112/62 138/84 112/70  Pulse: 72 72 80 76  Weight: 154 lb (69.854 kg)   154 lb (69.854 kg)     In general, anxious appearing women.  Cardiovascular: The patient  has a regular rate and rhythm and no carotid bruits.  Fundoscopy:  Disks are flat. Vessel caliber within normal limits.  Mental status:   The patient is oriented to person, place and time. Recent and remote memory are intact. Attention span and concentration are normal. Language including repetition, naming, following commands are intact. Fund of knowledge of current and historical events, as well as vocabulary are normal.  Cranial Nerves: Pupils are equally round and reactive to light. Visual fields full to confrontation. Extraocular movements are intact without nystagmus. Facial sensation and muscles of mastication are intact. Muscles of facial expression are symmetric. Hearing decreased to bilateral finger rub. Tongue protrusion, uvula, palate midline.  Shoulder shrug intact  Motor:  The patient has normal bulk and tone, no pronator drift.  There are no adventitious movements.  5/5 muscle strength bilaterally except for 4+/5 at right hip flexor.  Reflexes:   Biceps  Triceps Brachioradialis Knee Ankle  Right 2+  2+  2+   2+ 2+  Left  2+  2+  2+   2+ 1+  Toes down  Coordination:  Normal finger to nose.  No dysdiadokinesia.  No abnormal rebound.  H2S normal.  Sensation to temperature is does seem decreased in the legs although not in a clear length dependent manner.  Vibration decreased in feet.  Position sense intact.  Gait and Station are wide based unsteady.   Romberg is positive.  Vestibular:  perhaps she has a positive head thrust to the left.  D-H -;  no head shaking nystagmus, TMs seem normal.  Impression/Recs: 1.  Feeling of imbalance/feeling of motion - I don't find a clear peripheral vestibular abnormality.  However, it is tempting to believe that her congestion has caused some kind of peripheral vestibulopathy.  However, there are many things that argue against this.  First, she only has problems with standing and not with lying and quick head turns in bed don't bring on  her symptoms.  I do think it is smart to rule out a central cause, either from a brainstem stroke or cerebellar abnormality -- I therefore am going to get an MRI brain for this. 2.  Unsteadiness on feet - It is really hard to differentiate these two complaints for her.  However, she does appearing to have some degree of lack of sensation in her feet, and I am uncertain whether this is from a PN or a radiculopathy.  I am going to get an EMG/NCS of her RUE and RLE to see if she  has a peripheral neuropathy.  At the same time, I would like to see if she has a radiculopathy the explains her right hip flexion weakness.  Also, if she has cerebellar atrophy then this might explain her long term unsteadiness on her feet.  I am also going to get PN labs as well.  It is possible that her long term unsteadiness on her feet has just gotten worse and that this is now creating a feeling of imbalance or motion.  Given her history of congestion and its possible relationship to her complaints I would recommend treating her with a decongestant to see if this helps.  A referral to an ENT may be beneficial as well.   We will see the patient back in 2 months.  Thank you for having [place] see [name] in consultation.  Feel free to contact me with any questions.  Lupita Raider Modesto Charon, MD [facility], [place] [address] [place], [place] [address] Phone: [phone] Fax: [phone].

## 2011-05-28 LAB — C-REACTIVE PROTEIN: CRP: 0.21 mg/dL (ref <0.60)

## 2011-05-31 DIAGNOSIS — C259 Malignant neoplasm of pancreas, unspecified: Secondary | ICD-10-CM | POA: Diagnosis not present

## 2011-05-31 DIAGNOSIS — M899 Disorder of bone, unspecified: Secondary | ICD-10-CM | POA: Diagnosis not present

## 2011-05-31 DIAGNOSIS — E039 Hypothyroidism, unspecified: Secondary | ICD-10-CM | POA: Diagnosis not present

## 2011-05-31 DIAGNOSIS — Z87898 Personal history of other specified conditions: Secondary | ICD-10-CM | POA: Diagnosis not present

## 2011-05-31 LAB — SPEP & IFE WITH QIG
Albumin ELP: 58.6 % (ref 55.8–66.1)
Alpha-1-Globulin: 3.9 % (ref 2.9–4.9)
Alpha-2-Globulin: 12.6 % — ABNORMAL HIGH (ref 7.1–11.8)
Beta 2: 4.7 % (ref 3.2–6.5)
Beta Globulin: 6.2 % (ref 4.7–7.2)
Gamma Globulin: 14 % (ref 11.1–18.8)
IgA: 225 mg/dL (ref 69–380)
IgG (Immunoglobin G), Serum: 1110 mg/dL (ref 690–1700)
IgM, Serum: 111 mg/dL (ref 52–322)
Total Protein, Serum Electrophoresis: 7.2 g/dL (ref 6.0–8.3)

## 2011-06-01 ENCOUNTER — Ambulatory Visit
Admission: RE | Admit: 2011-06-01 | Discharge: 2011-06-01 | Disposition: A | Discharge status: Home / Self Care | Payer: Medicare Other | Source: Ambulatory Visit | Attending: Neurology | Admitting: Neurology

## 2011-06-01 ENCOUNTER — Inpatient Hospital Stay: Admission: RE | Admit: 2011-06-01 | Payer: Medicare Other | Source: Ambulatory Visit

## 2011-06-01 ENCOUNTER — Other Ambulatory Visit (HOSPITAL_COMMUNITY): Payer: Medicare Other

## 2011-06-01 DIAGNOSIS — R42 Dizziness and giddiness: Secondary | ICD-10-CM | POA: Diagnosis not present

## 2011-06-01 DIAGNOSIS — R51 Headache: Secondary | ICD-10-CM | POA: Diagnosis not present

## 2011-06-01 DIAGNOSIS — R2689 Other abnormalities of gait and mobility: Secondary | ICD-10-CM

## 2011-06-01 LAB — METHYLMALONIC ACID, SERUM: Methylmalonic Acid, Quant: 0.23 umol/L (ref <0.40)

## 2011-06-06 ENCOUNTER — Telehealth: Payer: Self-pay | Admitting: Neurology

## 2011-06-06 DIAGNOSIS — R42 Dizziness and giddiness: Secondary | ICD-10-CM | POA: Diagnosis not present

## 2011-06-06 NOTE — Telephone Encounter (Signed)
Pt's eye/ENT doctor, Dr. Christia Reading at [facility], told her she has something called "proprioception."  Dr. Jenne Pane suggested pt contact her neurologist to start some kind of "therapy" for this problem. She would like to know what to do next. Please advise.

## 2011-06-06 NOTE — Telephone Encounter (Signed)
Message copied by Benay Spice on Mon Jun 06, 2011 10:33 AM ------      Message from: Milas Gain      Created: Sat Jun 04, 2011 10:17 PM       Let Ms. Deremer know that her MRI looked good, with no sign of stroke or anything else to cause her imbalance.

## 2011-06-06 NOTE — Telephone Encounter (Signed)
Not sure what this is Dr. Modesto Charon. Is the "therapy" Vestibular Rehab by chance? Please advise. Thank you.

## 2011-06-06 NOTE — Telephone Encounter (Signed)
Spoke with the patient. Information given as per Dr. Modesto Charon below re: normal MRI brian. The patient c/o the same symptoms as when seen in the office. Awaiting NCV/EMG on 06/03.

## 2011-06-07 NOTE — Telephone Encounter (Signed)
She has already had vestibular rehab.  The ENT must of said she had a lack of proprioception which I suspected and am trying to determine why with a NCS and brain MRI.  IWe could send her to gait therapy at neuro-rehab but I think they will do some of the exercises that did in the vestibular rehab.  I think we should hold off on therapy until we get the results of the tests.

## 2011-06-07 NOTE — Telephone Encounter (Signed)
Called and spoke with the patient. Information given as per Dr. Modesto Charon below. The patient states she understands and can be patient. Nerve testing to be done June 3. No other issues voiced at this time. Advise the patient to call with questions or concerns.

## 2011-06-20 ENCOUNTER — Telehealth: Payer: Self-pay | Admitting: Neurology

## 2011-06-20 DIAGNOSIS — M79609 Pain in unspecified limb: Secondary | ICD-10-CM | POA: Diagnosis not present

## 2011-06-20 NOTE — Telephone Encounter (Signed)
Called and spoke with the patient. She was tearful throughout our conversation. She reports that she is "worse and losing ground." She is having trouble walking; legs feel like rubber bands. Has not fallen but feels like she is going to. She reports that she was not able to tolerate the NCV/EMG test as it was much too painful. She again brings up the diagnosis of proprioception as made by Dr. Jenne Pane. She thinks that she needs physical therapy to strengthen the muscles in her legs...Marland KitchenMarland Kitchen or so her orthopedic doctor told her. Her routine daily medications include Synthroid and Vitamin D. She reports this problem has been going on for months and she needs some help. Her f/u appointment with Dr. Modesto Charon is the first week in July. I told her I would make Dr. Modesto Charon aware of her situation and we would be in touch. She was ok with this plan. **Dr. Modesto Charon, please advise.

## 2011-06-20 NOTE — Telephone Encounter (Signed)
Pt LM requesting a return call from a nurse. She did not say what her questions were.

## 2011-06-21 DIAGNOSIS — R269 Unspecified abnormalities of gait and mobility: Secondary | ICD-10-CM | POA: Diagnosis not present

## 2011-06-22 ENCOUNTER — Other Ambulatory Visit: Payer: Self-pay | Admitting: Neurology

## 2011-06-22 DIAGNOSIS — R269 Unspecified abnormalities of gait and mobility: Secondary | ICD-10-CM

## 2011-06-22 DIAGNOSIS — R2689 Other abnormalities of gait and mobility: Secondary | ICD-10-CM

## 2011-06-22 NOTE — Telephone Encounter (Signed)
happy to send her to gait and balance therapy.  They might be able to give her some exercises to help her position sense(proprioception).   Vanessa Kick - just keep in mind she did have vestibular therapy -- not sure if this will provide additional benefit but we can try.

## 2011-06-22 NOTE — Telephone Encounter (Signed)
Spoke with the patient. Will refer to Neuro rehab for the gait and fall prevention program. All information faxed to their office. Patient aware that they will call her to schedule the appointment. No other issues or concerns voiced at this time.

## 2011-06-23 ENCOUNTER — Ambulatory Visit: Payer: Medicare Other | Attending: Geriatric Medicine

## 2011-06-23 DIAGNOSIS — R262 Difficulty in walking, not elsewhere classified: Secondary | ICD-10-CM | POA: Diagnosis not present

## 2011-06-23 DIAGNOSIS — R5381 Other malaise: Secondary | ICD-10-CM | POA: Insufficient documentation

## 2011-06-23 DIAGNOSIS — IMO0001 Reserved for inherently not codable concepts without codable children: Secondary | ICD-10-CM | POA: Diagnosis not present

## 2011-06-23 DIAGNOSIS — R269 Unspecified abnormalities of gait and mobility: Secondary | ICD-10-CM | POA: Diagnosis not present

## 2011-06-30 ENCOUNTER — Ambulatory Visit: Payer: Medicare Other

## 2011-06-30 NOTE — Progress Notes (Signed)
Patient terminated NCS study.  No useful info obtained.

## 2011-07-01 DIAGNOSIS — H1130 Conjunctival hemorrhage, unspecified eye: Secondary | ICD-10-CM | POA: Diagnosis not present

## 2011-07-01 DIAGNOSIS — H524 Presbyopia: Secondary | ICD-10-CM | POA: Diagnosis not present

## 2011-07-01 DIAGNOSIS — H52209 Unspecified astigmatism, unspecified eye: Secondary | ICD-10-CM | POA: Diagnosis not present

## 2011-07-01 DIAGNOSIS — Z961 Presence of intraocular lens: Secondary | ICD-10-CM | POA: Diagnosis not present

## 2011-07-04 ENCOUNTER — Telehealth: Payer: Self-pay | Admitting: Neurology

## 2011-07-04 ENCOUNTER — Ambulatory Visit: Payer: Medicare Other | Attending: Geriatric Medicine | Admitting: Physical Therapy

## 2011-07-04 DIAGNOSIS — R262 Difficulty in walking, not elsewhere classified: Secondary | ICD-10-CM | POA: Insufficient documentation

## 2011-07-04 DIAGNOSIS — R5381 Other malaise: Secondary | ICD-10-CM | POA: Diagnosis not present

## 2011-07-04 DIAGNOSIS — IMO0001 Reserved for inherently not codable concepts without codable children: Secondary | ICD-10-CM | POA: Diagnosis not present

## 2011-07-04 DIAGNOSIS — R269 Unspecified abnormalities of gait and mobility: Secondary | ICD-10-CM | POA: Insufficient documentation

## 2011-07-04 NOTE — Telephone Encounter (Signed)
The patient returned my call. She was asking who Dr. Modesto Charon was going to refer her to at [facility]. I told her that Dr. Modesto Charon was out of the office this week and in reading over her note from her new patient visit in April, there was no mention of a referral to [facility]. The patient states he verbally mentioned it to her during that visit and that her oncologist already said he would make the referral for her to [facility] for a second opinion. She states she will let us know who and when so we can coordinate her medical records. No other issues.

## 2011-07-04 NOTE — Telephone Encounter (Signed)
Left a message for the patient to return my call.  

## 2011-07-07 ENCOUNTER — Ambulatory Visit: Payer: Medicare Other

## 2011-07-12 ENCOUNTER — Ambulatory Visit: Payer: Medicare Other

## 2011-07-14 ENCOUNTER — Ambulatory Visit: Payer: Medicare Other

## 2011-07-19 DIAGNOSIS — R42 Dizziness and giddiness: Secondary | ICD-10-CM | POA: Diagnosis not present

## 2011-07-19 DIAGNOSIS — R269 Unspecified abnormalities of gait and mobility: Secondary | ICD-10-CM | POA: Diagnosis not present

## 2011-07-20 ENCOUNTER — Ambulatory Visit: Payer: Medicare Other

## 2011-07-21 ENCOUNTER — Ambulatory Visit: Payer: Medicare Other

## 2011-07-26 ENCOUNTER — Ambulatory Visit: Payer: Medicare Other

## 2011-07-27 ENCOUNTER — Ambulatory Visit: Payer: Medicare Other | Admitting: Physical Therapy

## 2011-08-01 ENCOUNTER — Ambulatory Visit: Payer: Medicare Other | Attending: Geriatric Medicine | Admitting: Physical Therapy

## 2011-08-01 ENCOUNTER — Other Ambulatory Visit: Payer: Self-pay | Admitting: Geriatric Medicine

## 2011-08-01 DIAGNOSIS — R269 Unspecified abnormalities of gait and mobility: Secondary | ICD-10-CM | POA: Diagnosis not present

## 2011-08-01 DIAGNOSIS — IMO0001 Reserved for inherently not codable concepts without codable children: Secondary | ICD-10-CM | POA: Diagnosis not present

## 2011-08-01 DIAGNOSIS — Z1231 Encounter for screening mammogram for malignant neoplasm of breast: Secondary | ICD-10-CM

## 2011-08-01 DIAGNOSIS — R262 Difficulty in walking, not elsewhere classified: Secondary | ICD-10-CM | POA: Diagnosis not present

## 2011-08-01 DIAGNOSIS — R5381 Other malaise: Secondary | ICD-10-CM | POA: Insufficient documentation

## 2011-08-02 ENCOUNTER — Ambulatory Visit: Payer: Medicare Other | Admitting: Neurology

## 2011-08-03 ENCOUNTER — Ambulatory Visit: Payer: Medicare Other | Admitting: Physical Therapy

## 2011-08-08 ENCOUNTER — Ambulatory Visit: Payer: Medicare Other | Admitting: Physical Therapy

## 2011-08-10 ENCOUNTER — Encounter: Payer: Medicare Other | Admitting: Physical Therapy

## 2011-08-15 ENCOUNTER — Encounter: Payer: Medicare Other | Admitting: Physical Therapy

## 2011-08-17 ENCOUNTER — Ambulatory Visit: Payer: Medicare Other | Admitting: Physical Therapy

## 2011-08-22 ENCOUNTER — Encounter: Payer: Medicare Other | Admitting: Physical Therapy

## 2011-08-23 ENCOUNTER — Ambulatory Visit
Admission: RE | Admit: 2011-08-23 | Discharge: 2011-08-23 | Disposition: A | Discharge status: Home / Self Care | Payer: Medicare Other | Source: Ambulatory Visit | Attending: Geriatric Medicine | Admitting: Geriatric Medicine

## 2011-08-23 DIAGNOSIS — Z1231 Encounter for screening mammogram for malignant neoplasm of breast: Secondary | ICD-10-CM | POA: Diagnosis not present

## 2011-08-24 ENCOUNTER — Encounter: Payer: Medicare Other | Admitting: Physical Therapy

## 2011-08-26 DIAGNOSIS — H9190 Unspecified hearing loss, unspecified ear: Secondary | ICD-10-CM | POA: Diagnosis not present

## 2011-08-26 DIAGNOSIS — H905 Unspecified sensorineural hearing loss: Secondary | ICD-10-CM | POA: Diagnosis not present

## 2011-08-26 DIAGNOSIS — R42 Dizziness and giddiness: Secondary | ICD-10-CM | POA: Diagnosis not present

## 2011-08-29 ENCOUNTER — Ambulatory Visit: Payer: Medicare Other

## 2011-08-29 ENCOUNTER — Encounter: Payer: Medicare Other | Admitting: Physical Therapy

## 2011-08-31 ENCOUNTER — Encounter: Payer: Medicare Other | Admitting: Physical Therapy

## 2011-09-06 DIAGNOSIS — M545 Low back pain, unspecified: Secondary | ICD-10-CM | POA: Diagnosis not present

## 2011-09-07 ENCOUNTER — Ambulatory Visit: Payer: Medicare Other

## 2011-09-14 DIAGNOSIS — R269 Unspecified abnormalities of gait and mobility: Secondary | ICD-10-CM | POA: Diagnosis not present

## 2011-09-14 DIAGNOSIS — R42 Dizziness and giddiness: Secondary | ICD-10-CM | POA: Diagnosis not present

## 2011-10-14 DIAGNOSIS — Z79899 Other long term (current) drug therapy: Secondary | ICD-10-CM | POA: Diagnosis not present

## 2011-10-14 DIAGNOSIS — I10 Essential (primary) hypertension: Secondary | ICD-10-CM | POA: Diagnosis not present

## 2011-10-14 DIAGNOSIS — R42 Dizziness and giddiness: Secondary | ICD-10-CM | POA: Diagnosis not present

## 2011-11-30 DIAGNOSIS — R5383 Other fatigue: Secondary | ICD-10-CM | POA: Diagnosis not present

## 2011-11-30 DIAGNOSIS — R5381 Other malaise: Secondary | ICD-10-CM | POA: Diagnosis not present

## 2011-12-04 DIAGNOSIS — N951 Menopausal and female climacteric states: Secondary | ICD-10-CM | POA: Diagnosis not present

## 2011-12-04 DIAGNOSIS — R5383 Other fatigue: Secondary | ICD-10-CM | POA: Diagnosis not present

## 2011-12-04 DIAGNOSIS — R5381 Other malaise: Secondary | ICD-10-CM | POA: Diagnosis not present

## 2011-12-28 DIAGNOSIS — R5381 Other malaise: Secondary | ICD-10-CM | POA: Diagnosis not present

## 2011-12-28 DIAGNOSIS — R5383 Other fatigue: Secondary | ICD-10-CM | POA: Diagnosis not present

## 2012-01-18 DIAGNOSIS — R5383 Other fatigue: Secondary | ICD-10-CM | POA: Diagnosis not present

## 2012-01-18 DIAGNOSIS — R5381 Other malaise: Secondary | ICD-10-CM | POA: Diagnosis not present

## 2012-01-30 DIAGNOSIS — I10 Essential (primary) hypertension: Secondary | ICD-10-CM | POA: Diagnosis not present

## 2012-01-30 DIAGNOSIS — R5381 Other malaise: Secondary | ICD-10-CM | POA: Diagnosis not present

## 2012-01-30 DIAGNOSIS — R5383 Other fatigue: Secondary | ICD-10-CM | POA: Diagnosis not present

## 2012-02-07 ENCOUNTER — Ambulatory Visit: Payer: Medicare Other | Admitting: Physical Therapy

## 2012-02-14 ENCOUNTER — Ambulatory Visit: Payer: Medicare Other | Admitting: Physical Therapy

## 2012-02-21 DIAGNOSIS — R5381 Other malaise: Secondary | ICD-10-CM | POA: Diagnosis not present

## 2012-02-21 DIAGNOSIS — R5383 Other fatigue: Secondary | ICD-10-CM | POA: Diagnosis not present

## 2012-02-23 ENCOUNTER — Ambulatory Visit: Payer: Medicare Other | Attending: Geriatric Medicine

## 2012-02-23 DIAGNOSIS — R5381 Other malaise: Secondary | ICD-10-CM | POA: Insufficient documentation

## 2012-02-23 DIAGNOSIS — IMO0001 Reserved for inherently not codable concepts without codable children: Secondary | ICD-10-CM | POA: Diagnosis not present

## 2012-02-23 DIAGNOSIS — M6281 Muscle weakness (generalized): Secondary | ICD-10-CM | POA: Diagnosis not present

## 2012-02-23 DIAGNOSIS — R262 Difficulty in walking, not elsewhere classified: Secondary | ICD-10-CM | POA: Diagnosis not present

## 2012-02-24 DIAGNOSIS — Z1331 Encounter for screening for depression: Secondary | ICD-10-CM | POA: Diagnosis not present

## 2012-02-24 DIAGNOSIS — Z0000 Encounter for general adult medical examination without abnormal findings: Secondary | ICD-10-CM | POA: Diagnosis not present

## 2012-02-27 ENCOUNTER — Ambulatory Visit: Payer: Medicare Other | Admitting: Physical Therapy

## 2012-03-01 ENCOUNTER — Ambulatory Visit: Payer: Medicare Other | Admitting: Physical Therapy

## 2012-03-05 ENCOUNTER — Ambulatory Visit: Payer: Medicare Other | Attending: Geriatric Medicine | Admitting: Physical Therapy

## 2012-03-05 DIAGNOSIS — R262 Difficulty in walking, not elsewhere classified: Secondary | ICD-10-CM | POA: Insufficient documentation

## 2012-03-05 DIAGNOSIS — R5381 Other malaise: Secondary | ICD-10-CM | POA: Diagnosis not present

## 2012-03-05 DIAGNOSIS — M6281 Muscle weakness (generalized): Secondary | ICD-10-CM | POA: Diagnosis not present

## 2012-03-05 DIAGNOSIS — IMO0001 Reserved for inherently not codable concepts without codable children: Secondary | ICD-10-CM | POA: Diagnosis not present

## 2012-03-07 ENCOUNTER — Encounter: Payer: Medicare Other | Admitting: Physical Therapy

## 2012-03-07 DIAGNOSIS — Z09 Encounter for follow-up examination after completed treatment for conditions other than malignant neoplasm: Secondary | ICD-10-CM | POA: Diagnosis not present

## 2012-03-07 DIAGNOSIS — C259 Malignant neoplasm of pancreas, unspecified: Secondary | ICD-10-CM | POA: Diagnosis not present

## 2012-03-07 DIAGNOSIS — Z8509 Personal history of malignant neoplasm of other digestive organs: Secondary | ICD-10-CM | POA: Diagnosis not present

## 2012-03-07 DIAGNOSIS — Z9889 Other specified postprocedural states: Secondary | ICD-10-CM | POA: Diagnosis not present

## 2012-03-16 ENCOUNTER — Encounter: Payer: Medicare Other | Admitting: Physical Therapy

## 2012-03-20 ENCOUNTER — Ambulatory Visit: Payer: Medicare Other | Admitting: Physical Therapy

## 2012-03-20 ENCOUNTER — Encounter: Payer: Medicare Other | Admitting: Physical Therapy

## 2012-03-22 ENCOUNTER — Ambulatory Visit: Payer: Medicare Other | Admitting: Physical Therapy

## 2012-03-27 ENCOUNTER — Ambulatory Visit: Payer: Medicare Other | Admitting: Physical Therapy

## 2012-03-28 DIAGNOSIS — R5381 Other malaise: Secondary | ICD-10-CM | POA: Diagnosis not present

## 2012-03-28 DIAGNOSIS — R5383 Other fatigue: Secondary | ICD-10-CM | POA: Diagnosis not present

## 2012-03-29 ENCOUNTER — Ambulatory Visit: Payer: Medicare Other

## 2012-04-05 DIAGNOSIS — J3489 Other specified disorders of nose and nasal sinuses: Secondary | ICD-10-CM | POA: Diagnosis not present

## 2012-05-01 DIAGNOSIS — R5383 Other fatigue: Secondary | ICD-10-CM | POA: Diagnosis not present

## 2012-05-01 DIAGNOSIS — R5381 Other malaise: Secondary | ICD-10-CM | POA: Diagnosis not present

## 2012-05-07 ENCOUNTER — Other Ambulatory Visit: Payer: Self-pay | Admitting: Family Medicine

## 2012-05-07 DIAGNOSIS — M810 Age-related osteoporosis without current pathological fracture: Secondary | ICD-10-CM

## 2012-06-07 ENCOUNTER — Other Ambulatory Visit: Payer: Self-pay

## 2012-06-07 DIAGNOSIS — Z1231 Encounter for screening mammogram for malignant neoplasm of breast: Secondary | ICD-10-CM

## 2012-06-14 DIAGNOSIS — R197 Diarrhea, unspecified: Secondary | ICD-10-CM | POA: Diagnosis not present

## 2012-06-15 DIAGNOSIS — R197 Diarrhea, unspecified: Secondary | ICD-10-CM | POA: Diagnosis not present

## 2012-06-21 DIAGNOSIS — R5383 Other fatigue: Secondary | ICD-10-CM | POA: Diagnosis not present

## 2012-06-21 DIAGNOSIS — R5381 Other malaise: Secondary | ICD-10-CM | POA: Diagnosis not present

## 2012-06-22 DIAGNOSIS — M810 Age-related osteoporosis without current pathological fracture: Secondary | ICD-10-CM | POA: Diagnosis not present

## 2012-06-22 DIAGNOSIS — E039 Hypothyroidism, unspecified: Secondary | ICD-10-CM | POA: Diagnosis not present

## 2012-07-05 DIAGNOSIS — H52209 Unspecified astigmatism, unspecified eye: Secondary | ICD-10-CM | POA: Diagnosis not present

## 2012-07-05 DIAGNOSIS — H04129 Dry eye syndrome of unspecified lacrimal gland: Secondary | ICD-10-CM | POA: Diagnosis not present

## 2012-07-30 DIAGNOSIS — R5381 Other malaise: Secondary | ICD-10-CM | POA: Diagnosis not present

## 2012-07-30 DIAGNOSIS — R5383 Other fatigue: Secondary | ICD-10-CM | POA: Diagnosis not present

## 2012-08-13 DIAGNOSIS — R197 Diarrhea, unspecified: Secondary | ICD-10-CM | POA: Diagnosis not present

## 2012-08-23 ENCOUNTER — Ambulatory Visit
Admission: RE | Admit: 2012-08-23 | Discharge: 2012-08-23 | Disposition: A | Discharge status: Home / Self Care | Payer: Medicare Other | Source: Ambulatory Visit

## 2012-08-23 ENCOUNTER — Ambulatory Visit
Admission: RE | Admit: 2012-08-23 | Discharge: 2012-08-23 | Disposition: A | Discharge status: Home / Self Care | Payer: Medicare Other | Source: Ambulatory Visit | Attending: Family Medicine | Admitting: Family Medicine

## 2012-08-23 DIAGNOSIS — Z1231 Encounter for screening mammogram for malignant neoplasm of breast: Secondary | ICD-10-CM

## 2012-08-23 DIAGNOSIS — M949 Disorder of cartilage, unspecified: Secondary | ICD-10-CM | POA: Diagnosis not present

## 2012-08-23 DIAGNOSIS — M899 Disorder of bone, unspecified: Secondary | ICD-10-CM | POA: Diagnosis not present

## 2012-08-23 DIAGNOSIS — M810 Age-related osteoporosis without current pathological fracture: Secondary | ICD-10-CM

## 2012-08-25 DIAGNOSIS — R5381 Other malaise: Secondary | ICD-10-CM | POA: Diagnosis not present

## 2012-08-25 DIAGNOSIS — R5383 Other fatigue: Secondary | ICD-10-CM | POA: Diagnosis not present

## 2012-08-30 DIAGNOSIS — R5383 Other fatigue: Secondary | ICD-10-CM | POA: Diagnosis not present

## 2012-08-30 DIAGNOSIS — R5381 Other malaise: Secondary | ICD-10-CM | POA: Diagnosis not present

## 2012-09-26 DIAGNOSIS — R5383 Other fatigue: Secondary | ICD-10-CM | POA: Diagnosis not present

## 2012-09-26 DIAGNOSIS — R5381 Other malaise: Secondary | ICD-10-CM | POA: Diagnosis not present

## 2012-10-23 DIAGNOSIS — I10 Essential (primary) hypertension: Secondary | ICD-10-CM | POA: Diagnosis not present

## 2012-10-23 DIAGNOSIS — R5383 Other fatigue: Secondary | ICD-10-CM | POA: Diagnosis not present

## 2012-10-23 DIAGNOSIS — R5381 Other malaise: Secondary | ICD-10-CM | POA: Diagnosis not present

## 2012-10-23 DIAGNOSIS — R269 Unspecified abnormalities of gait and mobility: Secondary | ICD-10-CM | POA: Diagnosis not present

## 2012-10-25 DIAGNOSIS — R5383 Other fatigue: Secondary | ICD-10-CM | POA: Diagnosis not present

## 2012-10-25 DIAGNOSIS — R7989 Other specified abnormal findings of blood chemistry: Secondary | ICD-10-CM | POA: Diagnosis not present

## 2012-10-25 DIAGNOSIS — C250 Malignant neoplasm of head of pancreas: Secondary | ICD-10-CM | POA: Diagnosis not present

## 2012-10-25 DIAGNOSIS — E559 Vitamin D deficiency, unspecified: Secondary | ICD-10-CM | POA: Diagnosis not present

## 2012-10-25 DIAGNOSIS — R5381 Other malaise: Secondary | ICD-10-CM | POA: Diagnosis not present

## 2012-10-25 DIAGNOSIS — D509 Iron deficiency anemia, unspecified: Secondary | ICD-10-CM | POA: Diagnosis not present

## 2012-10-25 DIAGNOSIS — E039 Hypothyroidism, unspecified: Secondary | ICD-10-CM | POA: Diagnosis not present

## 2012-11-21 DIAGNOSIS — R5381 Other malaise: Secondary | ICD-10-CM | POA: Diagnosis not present

## 2012-11-21 DIAGNOSIS — R5383 Other fatigue: Secondary | ICD-10-CM | POA: Diagnosis not present

## 2012-12-11 DIAGNOSIS — R197 Diarrhea, unspecified: Secondary | ICD-10-CM | POA: Diagnosis not present

## 2012-12-11 DIAGNOSIS — C259 Malignant neoplasm of pancreas, unspecified: Secondary | ICD-10-CM | POA: Diagnosis not present

## 2012-12-11 DIAGNOSIS — Z90411 Acquired partial absence of pancreas: Secondary | ICD-10-CM | POA: Diagnosis not present

## 2012-12-11 DIAGNOSIS — K219 Gastro-esophageal reflux disease without esophagitis: Secondary | ICD-10-CM | POA: Diagnosis not present

## 2012-12-11 DIAGNOSIS — K8689 Other specified diseases of pancreas: Secondary | ICD-10-CM | POA: Diagnosis not present

## 2012-12-11 DIAGNOSIS — M948X9 Other specified disorders of cartilage, unspecified sites: Secondary | ICD-10-CM | POA: Diagnosis not present

## 2012-12-11 DIAGNOSIS — E039 Hypothyroidism, unspecified: Secondary | ICD-10-CM | POA: Diagnosis not present

## 2012-12-11 DIAGNOSIS — K7689 Other specified diseases of liver: Secondary | ICD-10-CM | POA: Diagnosis not present

## 2012-12-11 DIAGNOSIS — Z8509 Personal history of malignant neoplasm of other digestive organs: Secondary | ICD-10-CM | POA: Diagnosis not present

## 2012-12-17 DIAGNOSIS — R197 Diarrhea, unspecified: Secondary | ICD-10-CM | POA: Diagnosis not present

## 2012-12-17 DIAGNOSIS — C259 Malignant neoplasm of pancreas, unspecified: Secondary | ICD-10-CM | POA: Diagnosis not present

## 2012-12-20 DIAGNOSIS — R197 Diarrhea, unspecified: Secondary | ICD-10-CM | POA: Diagnosis not present

## 2013-04-02 DIAGNOSIS — Z0000 Encounter for general adult medical examination without abnormal findings: Secondary | ICD-10-CM | POA: Diagnosis not present

## 2013-04-02 DIAGNOSIS — E039 Hypothyroidism, unspecified: Secondary | ICD-10-CM | POA: Diagnosis not present

## 2013-04-02 DIAGNOSIS — Z1331 Encounter for screening for depression: Secondary | ICD-10-CM | POA: Diagnosis not present

## 2013-08-15 ENCOUNTER — Other Ambulatory Visit: Payer: Self-pay

## 2013-08-15 DIAGNOSIS — Z1231 Encounter for screening mammogram for malignant neoplasm of breast: Secondary | ICD-10-CM

## 2013-08-16 DIAGNOSIS — H04129 Dry eye syndrome of unspecified lacrimal gland: Secondary | ICD-10-CM | POA: Diagnosis not present

## 2013-08-26 ENCOUNTER — Ambulatory Visit: Payer: Medicare Other

## 2013-09-05 ENCOUNTER — Ambulatory Visit: Payer: Medicare Other

## 2013-09-06 ENCOUNTER — Ambulatory Visit: Payer: Medicare Other

## 2013-09-12 ENCOUNTER — Ambulatory Visit (INDEPENDENT_AMBULATORY_CARE_PROVIDER_SITE_OTHER): Payer: Medicare Other | Admitting: Cardiology

## 2013-09-12 ENCOUNTER — Encounter: Payer: Self-pay | Admitting: Cardiology

## 2013-09-12 ENCOUNTER — Ambulatory Visit: Payer: Medicare Other

## 2013-09-12 VITALS — BP 152/64 | HR 74 | Ht 63.0 in | Wt 144.1 lb

## 2013-09-12 DIAGNOSIS — R5383 Other fatigue: Secondary | ICD-10-CM

## 2013-09-12 DIAGNOSIS — Z8509 Personal history of malignant neoplasm of other digestive organs: Secondary | ICD-10-CM | POA: Diagnosis not present

## 2013-09-12 DIAGNOSIS — I7090 Unspecified atherosclerosis: Secondary | ICD-10-CM

## 2013-09-12 DIAGNOSIS — R0789 Other chest pain: Secondary | ICD-10-CM

## 2013-09-12 DIAGNOSIS — R5381 Other malaise: Secondary | ICD-10-CM

## 2013-09-12 DIAGNOSIS — Z8507 Personal history of malignant neoplasm of pancreas: Secondary | ICD-10-CM

## 2013-09-12 DIAGNOSIS — I708 Atherosclerosis of other arteries: Secondary | ICD-10-CM | POA: Diagnosis not present

## 2013-09-12 NOTE — Patient Instructions (Addendum)
The current medical regimen is effective;  continue present plan and medications.  Your physician has requested that you have a lexiscan myoview. For further information please visit [email or web address]. Please follow instruction sheet, as given.  Follow up as needed. 

## 2013-09-12 NOTE — Progress Notes (Signed)
[place] [address], [address] [place], [place]  [address] Phone: [phone] Fax:  [phone]  Date:  09/12/2013   ID:  Katrina, Ellis 12/18/1932, MRN [id number]  PCP:  Mathews Argyle, MD   History of Present Illness: [name] is a 80 y.o. female with peripheral vascular disease, prior history of pancreatic cancer status post Whipple procedure here for followup. In 2010 she saw Dr. Tamala Julian. Recommendation was aspirin.  Has felt pressure on left breast side, crampy, heavy weight, in the morning. This can also occur at night when she lays down and is sometimes associated with GERD. She is unsure. Numbness to middle finger. Weakness and dyspnea. General malaise.  Takes digestive enzymes. They overpower her. She told me her story about her pancreatic cancer.  Had recurrence in 2008 after 2006 Whipple. Did not take chemo. Children were upset with decision. She went to [place] with targeted gene therapy. Stop therapy in 2010. She has been without cancer for this time.  Previously during a pancreatic cancer workup, she had mild carotid calcification. Generalized atherosclerosis.   Wt Readings from Last 3 Encounters:  09/12/13 144 lb 1.9 oz (65.372 kg)  05/27/11 154 lb (69.854 kg)     Past Medical History  Diagnosis Date  . Pancreatic cancer   . Hypothyroid   . Vitamin D deficiency     Past Surgical History  Procedure Laterality Date  . Cataract extraction  2011    bilateral  . Whipple procedure  2006  . Multiple hernias  2007, 2012    ventral  . Tonsillectomy  1942  . Partial hysterectomy  1973  . Bladder suspension  1999  . Shoulder arthroscopy  2011    rt  . Cholecystectomy  1992  . Lower back surgery  1994, 1995    L4-5    Current Outpatient Prescriptions  Medication Sig Dispense Refill  . levothyroxine (SYNTHROID, LEVOTHROID) 50 MCG tablet Take 50 mcg by mouth daily.      . lipase/protease/amylase (CREON) 12000 UNITS CPEP capsule Take 24,000  Units by mouth daily. 3-4 tabs per meal, 2 tabs per snacks      . Multiple Vitamins-Minerals (COMPLETE MULTIVITAMIN/MINERAL PO) Take 1 tablet by mouth daily.      . pantoprazole (PROTONIX) 40 MG tablet Take 40 mg by mouth as needed.       . Vitamin D, Ergocalciferol, (DRISDOL) 50000 UNITS CAPS Take 50,000 Units by mouth. Every Wednesday and Saturday       No current facility-administered medications for this visit.    Allergies:    Allergies  Allergen Reactions  . Amoxicillin   . Ciprofloxacin   . Dextromethorphan Polistirex Er   . Diatrizoate Meglumine & Sodium   . Flagyl [Metronidazole]   . Hydrocodone   . Latex   . Methocarbamol   . Oxycodone   . Prednisone   . Promethazine Hcl   . Tape     Social History:  The patient  reports that she has never smoked. She has never used smokeless tobacco. She reports that she drinks alcohol.   Family History  Problem Relation Age of Onset  . Heart disease Mother   . Heart failure Mother   . Hypertension Mother   . Heart attack Father   . Hypertension Sister   . Cancer Sister   . Heart disease Maternal Grandmother   . Cancer Maternal Grandfather   . Hypertension Sister     ROS:  Please see the history of present illness.   Positive for weakness, occasional dyspnea, malaise, chest pain.  All other systems reviewed and negative.   PHYSICAL EXAM: VS:  BP 152/64  Pulse 74  Ht 5\' 3"  (1.6 m)  Wt 144 lb 1.9 oz (65.372 kg)  BMI 25.54 kg/m2 Well nourished, well developed, in no acute distress HEENT: normal, [place]/AT, EOMI Neck: no JVD, normal carotid upstroke, no bruit Cardiac:  normal S1, S2; RRR; no murmur Lungs:  clear to auscultation bilaterally, no wheezing, rhonchi or rales Abd: soft, nontender, no hepatomegaly, no bruits Ext: no edema, 2+ distal pulses Skin: warm and dry GU: deferred Neuro: no focal abnormalities noted, AAO x 3, soft spoken  EKG:  09/12/13-sinus rhythm, 74, no other significant abnormalities     ASSESSMENT  AND PLAN:  1. Atypical chest pain-she has both typical as well as atypical features, sometimes pressure-like sensation, sometimes sharp, nonexertional. Could be musculoskeletal, neuropathic, GERD related. We will nonetheless given her age as risk factor, proceed with nuclear stress test to ensure that she is not have any degree of ischemia. 2. Arterial atherosclerosis -diet, exercise. 3. Gen. malaise-hopefully noncardiac related. 4. History of pancreatic cancer-as described above. 5. We will review stress test, as needed followup  Signed, Candee Furbish, MD [facility]  09/12/2013 2:07 PM

## 2013-09-16 ENCOUNTER — Telehealth: Payer: Self-pay | Admitting: Cardiology

## 2013-09-16 DIAGNOSIS — R5381 Other malaise: Secondary | ICD-10-CM | POA: Diagnosis not present

## 2013-09-16 DIAGNOSIS — R5383 Other fatigue: Secondary | ICD-10-CM | POA: Diagnosis not present

## 2013-09-16 NOTE — Telephone Encounter (Signed)
New message    Patient calling need to discuss stress test procedure

## 2013-09-16 NOTE — Telephone Encounter (Addendum)
Spoke with pt and she wanted to make sure she needed to proceed with stress test. Pt c/o continued weakness, diarrhea and left sided chest pain (while lying down at night), which is relieved with aspirin. Pt will be seeing her doctor at [facility] tomorrow because of her pancreatic cancer. Pt wanted to know if there was an alternative. I told her there probably wasn't another alternative, but I would let Dr. Marlou Porch know. She also states if she needs to see cardiology again she would like to be seen by Dr. Tamala Julian since she has seen him in the past (5 years ago or so). Pt will also go see Dr. Felipa Eth to see if they can check her blood count because she feels she may be anemic. Pt will keep myoview appt for 10/01/13 at this time, but she if she doesn't get her strength up she may have to cancel. FYI to Dr. Marlou Porch.

## 2013-09-16 NOTE — Telephone Encounter (Signed)
Reviewed. An option for her would be to discuss with Dr. Tamala Julian. If she would like, try to set up appt with him.  Candee Furbish, MD

## 2013-09-16 NOTE — Telephone Encounter (Signed)
To Dr. Tamala Julian, please advise.

## 2013-09-18 ENCOUNTER — Ambulatory Visit: Payer: Medicare Other

## 2013-09-23 DIAGNOSIS — H40019 Open angle with borderline findings, low risk, unspecified eye: Secondary | ICD-10-CM | POA: Diagnosis not present

## 2013-09-24 NOTE — Telephone Encounter (Signed)
If we are to exclude heart as cause of pain in chest she needs to have Nuclear stress. She can decide against doing it and that would be ok.

## 2013-09-25 ENCOUNTER — Ambulatory Visit
Admission: RE | Admit: 2013-09-25 | Discharge: 2013-09-25 | Disposition: A | Discharge status: Home / Self Care | Payer: Medicare Other | Source: Ambulatory Visit

## 2013-09-25 DIAGNOSIS — Z1231 Encounter for screening mammogram for malignant neoplasm of breast: Secondary | ICD-10-CM | POA: Diagnosis not present

## 2013-09-26 NOTE — Telephone Encounter (Signed)
Pt currently has appt to have myoview [place] on 9/1. Pt sts that she will need to reschedule. Pt provided the nuclear dept direct tel # to reschedule. Pt sts that she saw Dr.Smith in the past and wants to continuing she Dr.Smith. Adv her I will fwd that rqst to him. She verbalized understanding

## 2013-09-26 NOTE — Telephone Encounter (Signed)
called to give pt Dr.Smith's recommendations to have a myoview.lmtcb

## 2013-10-01 ENCOUNTER — Encounter (HOSPITAL_COMMUNITY): Payer: Medicare Other

## 2013-11-21 DIAGNOSIS — K868 Other specified diseases of pancreas: Secondary | ICD-10-CM | POA: Diagnosis not present

## 2013-11-25 ENCOUNTER — Telehealth: Payer: Self-pay | Admitting: Cardiology

## 2013-11-25 NOTE — Telephone Encounter (Signed)
Called pt and reviewed instructions and what to expect during test.  She had no further questions and thanked me for my time.

## 2013-11-25 NOTE — Telephone Encounter (Signed)
New Message      Pt calling to reschedule Myocardial Perfusion. Pt has a lot of questions in regards to how the test works and what exactly will be done. Please call pt back and advise.

## 2013-11-26 DIAGNOSIS — M8580 Other specified disorders of bone density and structure, unspecified site: Secondary | ICD-10-CM | POA: Diagnosis not present

## 2013-11-26 DIAGNOSIS — K760 Fatty (change of) liver, not elsewhere classified: Secondary | ICD-10-CM | POA: Diagnosis not present

## 2013-11-26 DIAGNOSIS — C259 Malignant neoplasm of pancreas, unspecified: Secondary | ICD-10-CM | POA: Diagnosis not present

## 2013-11-26 DIAGNOSIS — E039 Hypothyroidism, unspecified: Secondary | ICD-10-CM | POA: Diagnosis not present

## 2013-11-26 DIAGNOSIS — R7309 Other abnormal glucose: Secondary | ICD-10-CM | POA: Diagnosis not present

## 2013-11-26 DIAGNOSIS — R079 Chest pain, unspecified: Secondary | ICD-10-CM | POA: Diagnosis not present

## 2013-11-26 DIAGNOSIS — R590 Localized enlarged lymph nodes: Secondary | ICD-10-CM | POA: Diagnosis not present

## 2013-11-26 DIAGNOSIS — Z8507 Personal history of malignant neoplasm of pancreas: Secondary | ICD-10-CM | POA: Diagnosis not present

## 2013-11-26 DIAGNOSIS — Z08 Encounter for follow-up examination after completed treatment for malignant neoplasm: Secondary | ICD-10-CM | POA: Diagnosis not present

## 2013-11-27 ENCOUNTER — Ambulatory Visit (HOSPITAL_COMMUNITY): Payer: Medicare Other | Attending: Cardiovascular Disease | Admitting: Radiology

## 2013-11-27 VITALS — BP 149/50 | Ht 63.0 in | Wt 142.0 lb

## 2013-11-27 DIAGNOSIS — R079 Chest pain, unspecified: Secondary | ICD-10-CM | POA: Diagnosis not present

## 2013-11-27 DIAGNOSIS — R0789 Other chest pain: Secondary | ICD-10-CM | POA: Diagnosis not present

## 2013-11-27 DIAGNOSIS — I708 Atherosclerosis of other arteries: Secondary | ICD-10-CM | POA: Diagnosis not present

## 2013-11-27 DIAGNOSIS — R1084 Generalized abdominal pain: Secondary | ICD-10-CM

## 2013-11-27 MED ORDER — REGADENOSON 0.4 MG/5ML IV SOLN
0.4000 mg | Freq: Once | INTRAVENOUS | Status: AC
Start: 2013-11-27 — End: 2013-11-27
  Administered 2013-11-27: 0.4 mg via INTRAVENOUS

## 2013-11-27 MED ORDER — TECHNETIUM TC 99M SESTAMIBI GENERIC - CARDIOLITE
11.0000 | Freq: Once | INTRAVENOUS | Status: AC | PRN
  Administered 2013-11-27: 11 via INTRAVENOUS

## 2013-11-27 MED ORDER — TECHNETIUM TC 99M SESTAMIBI GENERIC - CARDIOLITE
33.0000 | Freq: Once | INTRAVENOUS | Status: AC | PRN
  Administered 2013-11-27: 33 via INTRAVENOUS

## 2013-11-27 MED ORDER — AMINOPHYLLINE 25 MG/ML IV SOLN
75.0000 mg | Freq: Two times a day (BID) | INTRAVENOUS | Status: DC | PRN
  Administered 2013-11-27: 75 mg via INTRAVENOUS

## 2013-11-27 NOTE — Progress Notes (Signed)
[place] [address] [place], [place] [address] [phone]    Cardiology Nuclear Med Study  [name] is a 80 y.o. female     MRN : [id number]     DOB: 07-22-1932  Procedure Date: 11/27/2013  Nuclear Med Background Indication for Stress Test:  Evaluation for Ischemia History:  MPI: 40 yrs ago Pancreatic CA Cardiac Risk Factors: None  Symptoms:  Chest Pain   Nuclear Pre-Procedure Caffeine/Decaff Intake:  None> 12 hrs NPO After: 7:00am   Lungs:  clear O2 Sat: 95% on room air. IV 0.9% NS with Angio Cath:  22g  IV Site: R Wrist x 1, tolerated well except complained of pain with stick that subsided quickly, no problem with insertion, site unremarkable. IV Started by:  Irven Baltimore, RN  Chest Size (in):  38 Cup Size: C  Height: 5\' 3"  (1.6 m)  Weight:  142 lb (64.411 kg)  BMI:  Body mass index is 25.16 kg/(m^2). Tech Comments:  Aminophylline 150 mg IV given for symptoms. All were given before leaving.    Nuclear Med Study 1 or 2 day study: 1 day  Stress Test Type:  Lexiscan  Reading MD: N/A  Order Authorizing Provider:  Candee Furbish, MD  Resting Radionuclide: Technetium 59m Sestamibi  Resting Radionuclide Dose: 11.0 mCi   Stress Radionuclide:  Technetium 55m Sestamibi  Stress Radionuclide Dose: 33.0 mCi           Stress Protocol Rest HR: 71 Stress HR: 100  Rest BP: 149/50 Stress BP: 152/71  Exercise Time (min): n/a METS: n/a   Predicted Max HR: 141 bpm % Max HR: 70.92 bpm Rate Pressure Product: 15200   Dose of Adenosine (mg):  n/a Dose of Lexiscan: 0.4 mg  Dose of Atropine (mg): n/a Dose of Dobutamine: n/a mcg/kg/min (at max HR)  Stress Test Technologist: Perrin Maltese, EMT-P  Nuclear Technologist:  Earl Many, CNMT     Rest Procedure:  Myocardial perfusion imaging was performed at rest 45 minutes following the intravenous administration of Technetium 60m Sestamibi. Rest ECG: NSR with non-specific ST-T wave  changes  Stress Procedure:  The patient received IV Lexiscan 0.4 mg over 15-seconds.  Technetium 68m Sestamibi injected at 30-seconds. This patient had  A headache and extreme abdominal pain and cramping with the Lexiscan injection. Quantitative spect images were obtained after a 45 minute delay. Stress ECG: No significant change from baseline ECG  QPS Raw Data Images:  Normal; no motion artifact; normal heart/lung ratio. Stress Images:  Normal homogeneous uptake in all areas of the myocardium. Rest Images:  Normal homogeneous uptake in all areas of the myocardium. Subtraction (SDS):  No evidence of ischemia. Transient Ischemic Dilatation (Normal <1.22):  1.05 Lung/Heart Ratio (Normal <0.45):  0.27  Quantitative Gated Spect Images QGS EDV:  55 ml QGS ESV:  13 ml  Impression Exercise Capacity:  Lexiscan with low level exercise. BP Response:  Normal blood pressure response. Clinical Symptoms:  No chest pain. ECG Impression:  No significant ST segment change suggestive of ischemia. Comparison with Prior Nuclear Study: No images to compare  Overall Impression:  Normal stress nuclear study.  LV Ejection Fraction: 77%.  LV Wall Motion:  NL LV Function; NL Wall Motion  Darlin Coco MD

## 2013-11-28 ENCOUNTER — Telehealth: Payer: Self-pay | Admitting: Cardiology

## 2013-11-28 NOTE — Telephone Encounter (Signed)
New message          Pt is wanting to know if she can get a copy of her test results from yesterday

## 2013-11-28 NOTE — Telephone Encounter (Signed)
Patient requesting a copy of her stress test from yesterday. She is coming in today because her husband is seeing [facility] Dopp. Read by dr. Mare Ferrari.

## 2013-11-29 ENCOUNTER — Other Ambulatory Visit: Payer: Self-pay | Admitting: Dermatology

## 2013-11-29 DIAGNOSIS — B079 Viral wart, unspecified: Secondary | ICD-10-CM | POA: Diagnosis not present

## 2013-11-29 DIAGNOSIS — L821 Other seborrheic keratosis: Secondary | ICD-10-CM | POA: Diagnosis not present

## 2013-11-29 DIAGNOSIS — D235 Other benign neoplasm of skin of trunk: Secondary | ICD-10-CM | POA: Diagnosis not present

## 2013-11-29 DIAGNOSIS — D1801 Hemangioma of skin and subcutaneous tissue: Secondary | ICD-10-CM | POA: Diagnosis not present

## 2013-11-29 DIAGNOSIS — D485 Neoplasm of uncertain behavior of skin: Secondary | ICD-10-CM | POA: Diagnosis not present

## 2013-11-29 DIAGNOSIS — L919 Hypertrophic disorder of the skin, unspecified: Secondary | ICD-10-CM | POA: Diagnosis not present

## 2013-11-29 DIAGNOSIS — D3617 Benign neoplasm of peripheral nerves and autonomic nervous system of trunk, unspecified: Secondary | ICD-10-CM | POA: Diagnosis not present

## 2013-11-29 DIAGNOSIS — D225 Melanocytic nevi of trunk: Secondary | ICD-10-CM | POA: Diagnosis not present

## 2013-12-04 ENCOUNTER — Encounter: Payer: Self-pay | Admitting: Cardiology

## 2013-12-09 DIAGNOSIS — R918 Other nonspecific abnormal finding of lung field: Secondary | ICD-10-CM | POA: Diagnosis not present

## 2013-12-09 DIAGNOSIS — C253 Malignant neoplasm of pancreatic duct: Secondary | ICD-10-CM | POA: Diagnosis not present

## 2013-12-09 DIAGNOSIS — C259 Malignant neoplasm of pancreas, unspecified: Secondary | ICD-10-CM | POA: Diagnosis not present

## 2013-12-09 DIAGNOSIS — R911 Solitary pulmonary nodule: Secondary | ICD-10-CM | POA: Diagnosis not present

## 2013-12-09 DIAGNOSIS — R079 Chest pain, unspecified: Secondary | ICD-10-CM | POA: Diagnosis not present

## 2013-12-09 DIAGNOSIS — M549 Dorsalgia, unspecified: Secondary | ICD-10-CM | POA: Diagnosis not present

## 2014-01-01 DIAGNOSIS — H40012 Open angle with borderline findings, low risk, left eye: Secondary | ICD-10-CM | POA: Diagnosis not present

## 2014-02-17 DIAGNOSIS — K210 Gastro-esophageal reflux disease with esophagitis: Secondary | ICD-10-CM | POA: Diagnosis not present

## 2014-02-17 DIAGNOSIS — Z8507 Personal history of malignant neoplasm of pancreas: Secondary | ICD-10-CM | POA: Diagnosis not present

## 2014-02-17 DIAGNOSIS — R197 Diarrhea, unspecified: Secondary | ICD-10-CM | POA: Diagnosis not present

## 2014-02-17 DIAGNOSIS — R079 Chest pain, unspecified: Secondary | ICD-10-CM | POA: Diagnosis not present

## 2014-02-28 DIAGNOSIS — Z79899 Other long term (current) drug therapy: Secondary | ICD-10-CM | POA: Diagnosis not present

## 2014-02-28 DIAGNOSIS — R197 Diarrhea, unspecified: Secondary | ICD-10-CM | POA: Diagnosis not present

## 2014-02-28 DIAGNOSIS — R5383 Other fatigue: Secondary | ICD-10-CM | POA: Diagnosis not present

## 2014-02-28 DIAGNOSIS — I10 Essential (primary) hypertension: Secondary | ICD-10-CM | POA: Diagnosis not present

## 2014-02-28 DIAGNOSIS — E039 Hypothyroidism, unspecified: Secondary | ICD-10-CM | POA: Diagnosis not present

## 2014-03-10 DIAGNOSIS — K760 Fatty (change of) liver, not elsewhere classified: Secondary | ICD-10-CM | POA: Diagnosis not present

## 2014-03-10 DIAGNOSIS — C253 Malignant neoplasm of pancreatic duct: Secondary | ICD-10-CM | POA: Diagnosis not present

## 2014-04-09 ENCOUNTER — Telehealth: Payer: Self-pay

## 2014-04-09 NOTE — Telephone Encounter (Signed)
Error

## 2014-05-08 DIAGNOSIS — Z803 Family history of malignant neoplasm of breast: Secondary | ICD-10-CM | POA: Diagnosis not present

## 2014-05-08 DIAGNOSIS — R1909 Other intra-abdominal and pelvic swelling, mass and lump: Secondary | ICD-10-CM | POA: Diagnosis not present

## 2014-05-08 DIAGNOSIS — Z8507 Personal history of malignant neoplasm of pancreas: Secondary | ICD-10-CM | POA: Diagnosis not present

## 2014-05-08 DIAGNOSIS — K868 Other specified diseases of pancreas: Secondary | ICD-10-CM | POA: Diagnosis not present

## 2014-05-08 DIAGNOSIS — N8320 Unspecified ovarian cysts: Secondary | ICD-10-CM | POA: Diagnosis not present

## 2014-05-08 DIAGNOSIS — Z90710 Acquired absence of both cervix and uterus: Secondary | ICD-10-CM | POA: Diagnosis not present

## 2014-05-08 DIAGNOSIS — Z9889 Other specified postprocedural states: Secondary | ICD-10-CM | POA: Diagnosis not present

## 2014-05-08 DIAGNOSIS — C259 Malignant neoplasm of pancreas, unspecified: Secondary | ICD-10-CM | POA: Diagnosis not present

## 2014-05-08 DIAGNOSIS — N8329 Other ovarian cysts: Secondary | ICD-10-CM | POA: Diagnosis not present

## 2014-05-20 DIAGNOSIS — S63502A Unspecified sprain of left wrist, initial encounter: Secondary | ICD-10-CM | POA: Diagnosis not present

## 2014-05-20 DIAGNOSIS — M25512 Pain in left shoulder: Secondary | ICD-10-CM | POA: Diagnosis not present

## 2014-05-20 DIAGNOSIS — M542 Cervicalgia: Secondary | ICD-10-CM | POA: Diagnosis not present

## 2014-05-26 DIAGNOSIS — Z0000 Encounter for general adult medical examination without abnormal findings: Secondary | ICD-10-CM | POA: Diagnosis not present

## 2014-05-26 DIAGNOSIS — E039 Hypothyroidism, unspecified: Secondary | ICD-10-CM | POA: Diagnosis not present

## 2014-05-26 DIAGNOSIS — I10 Essential (primary) hypertension: Secondary | ICD-10-CM | POA: Diagnosis not present

## 2014-05-26 DIAGNOSIS — M8580 Other specified disorders of bone density and structure, unspecified site: Secondary | ICD-10-CM | POA: Diagnosis not present

## 2014-05-26 DIAGNOSIS — Z1389 Encounter for screening for other disorder: Secondary | ICD-10-CM | POA: Diagnosis not present

## 2014-06-19 DIAGNOSIS — M5030 Other cervical disc degeneration, unspecified cervical region: Secondary | ICD-10-CM | POA: Diagnosis not present

## 2014-07-01 DIAGNOSIS — M542 Cervicalgia: Secondary | ICD-10-CM | POA: Diagnosis not present

## 2014-07-01 DIAGNOSIS — M5412 Radiculopathy, cervical region: Secondary | ICD-10-CM | POA: Diagnosis not present

## 2014-07-08 DIAGNOSIS — M542 Cervicalgia: Secondary | ICD-10-CM | POA: Diagnosis not present

## 2014-07-08 DIAGNOSIS — M5412 Radiculopathy, cervical region: Secondary | ICD-10-CM | POA: Diagnosis not present

## 2014-07-11 DIAGNOSIS — R1013 Epigastric pain: Secondary | ICD-10-CM | POA: Diagnosis not present

## 2014-07-11 DIAGNOSIS — Z79899 Other long term (current) drug therapy: Secondary | ICD-10-CM | POA: Diagnosis not present

## 2014-07-11 DIAGNOSIS — R197 Diarrhea, unspecified: Secondary | ICD-10-CM | POA: Diagnosis not present

## 2014-07-11 DIAGNOSIS — Z8507 Personal history of malignant neoplasm of pancreas: Secondary | ICD-10-CM | POA: Diagnosis not present

## 2014-07-28 ENCOUNTER — Other Ambulatory Visit: Payer: Self-pay

## 2014-08-05 DIAGNOSIS — M542 Cervicalgia: Secondary | ICD-10-CM | POA: Diagnosis not present

## 2014-08-05 DIAGNOSIS — M5412 Radiculopathy, cervical region: Secondary | ICD-10-CM | POA: Diagnosis not present

## 2014-08-07 DIAGNOSIS — Z90710 Acquired absence of both cervix and uterus: Secondary | ICD-10-CM | POA: Diagnosis not present

## 2014-08-07 DIAGNOSIS — Z9889 Other specified postprocedural states: Secondary | ICD-10-CM | POA: Diagnosis not present

## 2014-08-07 DIAGNOSIS — N8320 Unspecified ovarian cysts: Secondary | ICD-10-CM | POA: Diagnosis not present

## 2014-08-07 DIAGNOSIS — Z8507 Personal history of malignant neoplasm of pancreas: Secondary | ICD-10-CM | POA: Diagnosis not present

## 2014-08-11 DIAGNOSIS — M542 Cervicalgia: Secondary | ICD-10-CM | POA: Diagnosis not present

## 2014-08-11 DIAGNOSIS — M5412 Radiculopathy, cervical region: Secondary | ICD-10-CM | POA: Diagnosis not present

## 2014-08-12 ENCOUNTER — Other Ambulatory Visit: Payer: Self-pay

## 2014-08-12 DIAGNOSIS — Z9289 Personal history of other medical treatment: Secondary | ICD-10-CM

## 2014-08-14 DIAGNOSIS — M542 Cervicalgia: Secondary | ICD-10-CM | POA: Diagnosis not present

## 2014-08-14 DIAGNOSIS — M5412 Radiculopathy, cervical region: Secondary | ICD-10-CM | POA: Diagnosis not present

## 2014-08-19 DIAGNOSIS — M5412 Radiculopathy, cervical region: Secondary | ICD-10-CM | POA: Diagnosis not present

## 2014-08-19 DIAGNOSIS — M542 Cervicalgia: Secondary | ICD-10-CM | POA: Diagnosis not present

## 2014-08-21 DIAGNOSIS — M542 Cervicalgia: Secondary | ICD-10-CM | POA: Diagnosis not present

## 2014-08-21 DIAGNOSIS — M5412 Radiculopathy, cervical region: Secondary | ICD-10-CM | POA: Diagnosis not present

## 2014-08-26 DIAGNOSIS — M542 Cervicalgia: Secondary | ICD-10-CM | POA: Diagnosis not present

## 2014-08-26 DIAGNOSIS — M5412 Radiculopathy, cervical region: Secondary | ICD-10-CM | POA: Diagnosis not present

## 2014-08-28 DIAGNOSIS — M542 Cervicalgia: Secondary | ICD-10-CM | POA: Diagnosis not present

## 2014-08-28 DIAGNOSIS — M5412 Radiculopathy, cervical region: Secondary | ICD-10-CM | POA: Diagnosis not present

## 2014-09-03 DIAGNOSIS — M542 Cervicalgia: Secondary | ICD-10-CM | POA: Diagnosis not present

## 2014-09-03 DIAGNOSIS — M5412 Radiculopathy, cervical region: Secondary | ICD-10-CM | POA: Diagnosis not present

## 2014-09-08 DIAGNOSIS — M5412 Radiculopathy, cervical region: Secondary | ICD-10-CM | POA: Diagnosis not present

## 2014-09-08 DIAGNOSIS — M542 Cervicalgia: Secondary | ICD-10-CM | POA: Diagnosis not present

## 2014-09-11 DIAGNOSIS — M542 Cervicalgia: Secondary | ICD-10-CM | POA: Diagnosis not present

## 2014-09-11 DIAGNOSIS — M5412 Radiculopathy, cervical region: Secondary | ICD-10-CM | POA: Diagnosis not present

## 2014-09-24 DIAGNOSIS — M5412 Radiculopathy, cervical region: Secondary | ICD-10-CM | POA: Diagnosis not present

## 2014-09-24 DIAGNOSIS — M542 Cervicalgia: Secondary | ICD-10-CM | POA: Diagnosis not present

## 2014-10-01 ENCOUNTER — Ambulatory Visit
Admission: RE | Admit: 2014-10-01 | Discharge: 2014-10-01 | Disposition: A | Discharge status: Home / Self Care | Payer: Medicare Other | Source: Ambulatory Visit

## 2014-10-01 DIAGNOSIS — Z1231 Encounter for screening mammogram for malignant neoplasm of breast: Secondary | ICD-10-CM | POA: Diagnosis not present

## 2014-10-01 DIAGNOSIS — Z9289 Personal history of other medical treatment: Secondary | ICD-10-CM

## 2014-10-02 DIAGNOSIS — M542 Cervicalgia: Secondary | ICD-10-CM | POA: Diagnosis not present

## 2014-10-02 DIAGNOSIS — M5412 Radiculopathy, cervical region: Secondary | ICD-10-CM | POA: Diagnosis not present

## 2014-10-09 DIAGNOSIS — M5412 Radiculopathy, cervical region: Secondary | ICD-10-CM | POA: Diagnosis not present

## 2014-10-09 DIAGNOSIS — M542 Cervicalgia: Secondary | ICD-10-CM | POA: Diagnosis not present

## 2014-10-28 DIAGNOSIS — R269 Unspecified abnormalities of gait and mobility: Secondary | ICD-10-CM | POA: Diagnosis not present

## 2014-10-28 DIAGNOSIS — R197 Diarrhea, unspecified: Secondary | ICD-10-CM | POA: Diagnosis not present

## 2014-10-28 DIAGNOSIS — E039 Hypothyroidism, unspecified: Secondary | ICD-10-CM | POA: Diagnosis not present

## 2014-10-28 DIAGNOSIS — R5383 Other fatigue: Secondary | ICD-10-CM | POA: Diagnosis not present

## 2014-10-30 DIAGNOSIS — R490 Dysphonia: Secondary | ICD-10-CM | POA: Diagnosis not present

## 2014-10-30 DIAGNOSIS — R5383 Other fatigue: Secondary | ICD-10-CM | POA: Diagnosis not present

## 2014-10-30 DIAGNOSIS — R197 Diarrhea, unspecified: Secondary | ICD-10-CM | POA: Diagnosis not present

## 2014-10-30 DIAGNOSIS — I10 Essential (primary) hypertension: Secondary | ICD-10-CM | POA: Diagnosis not present

## 2014-11-20 DIAGNOSIS — N83201 Unspecified ovarian cyst, right side: Secondary | ICD-10-CM | POA: Diagnosis not present

## 2014-12-08 DIAGNOSIS — N83209 Unspecified ovarian cyst, unspecified side: Secondary | ICD-10-CM | POA: Diagnosis not present

## 2014-12-08 DIAGNOSIS — Z91041 Radiographic dye allergy status: Secondary | ICD-10-CM | POA: Diagnosis not present

## 2014-12-08 DIAGNOSIS — M8580 Other specified disorders of bone density and structure, unspecified site: Secondary | ICD-10-CM | POA: Diagnosis not present

## 2014-12-08 DIAGNOSIS — Z9049 Acquired absence of other specified parts of digestive tract: Secondary | ICD-10-CM | POA: Diagnosis not present

## 2014-12-08 DIAGNOSIS — R0789 Other chest pain: Secondary | ICD-10-CM | POA: Diagnosis not present

## 2014-12-08 DIAGNOSIS — Z9889 Other specified postprocedural states: Secondary | ICD-10-CM | POA: Diagnosis not present

## 2014-12-08 DIAGNOSIS — Z08 Encounter for follow-up examination after completed treatment for malignant neoplasm: Secondary | ICD-10-CM | POA: Diagnosis not present

## 2014-12-08 DIAGNOSIS — C250 Malignant neoplasm of head of pancreas: Secondary | ICD-10-CM | POA: Diagnosis not present

## 2014-12-08 DIAGNOSIS — R599 Enlarged lymph nodes, unspecified: Secondary | ICD-10-CM | POA: Diagnosis not present

## 2014-12-08 DIAGNOSIS — K8681 Exocrine pancreatic insufficiency: Secondary | ICD-10-CM | POA: Diagnosis not present

## 2014-12-08 DIAGNOSIS — E039 Hypothyroidism, unspecified: Secondary | ICD-10-CM | POA: Diagnosis not present

## 2014-12-08 DIAGNOSIS — Z8507 Personal history of malignant neoplasm of pancreas: Secondary | ICD-10-CM | POA: Diagnosis not present

## 2014-12-08 DIAGNOSIS — Z923 Personal history of irradiation: Secondary | ICD-10-CM | POA: Diagnosis not present

## 2014-12-08 DIAGNOSIS — K219 Gastro-esophageal reflux disease without esophagitis: Secondary | ICD-10-CM | POA: Diagnosis not present

## 2014-12-08 DIAGNOSIS — Z79899 Other long term (current) drug therapy: Secondary | ICD-10-CM | POA: Diagnosis not present

## 2014-12-08 DIAGNOSIS — C259 Malignant neoplasm of pancreas, unspecified: Secondary | ICD-10-CM | POA: Diagnosis not present

## 2014-12-08 DIAGNOSIS — R197 Diarrhea, unspecified: Secondary | ICD-10-CM | POA: Diagnosis not present

## 2015-01-07 DIAGNOSIS — H40011 Open angle with borderline findings, low risk, right eye: Secondary | ICD-10-CM | POA: Diagnosis not present

## 2015-01-07 DIAGNOSIS — H40012 Open angle with borderline findings, low risk, left eye: Secondary | ICD-10-CM | POA: Diagnosis not present

## 2015-01-07 DIAGNOSIS — Z961 Presence of intraocular lens: Secondary | ICD-10-CM | POA: Diagnosis not present

## 2015-01-07 DIAGNOSIS — H04123 Dry eye syndrome of bilateral lacrimal glands: Secondary | ICD-10-CM | POA: Diagnosis not present

## 2015-02-24 DIAGNOSIS — J310 Chronic rhinitis: Secondary | ICD-10-CM | POA: Diagnosis not present

## 2015-03-09 DIAGNOSIS — E039 Hypothyroidism, unspecified: Secondary | ICD-10-CM | POA: Diagnosis not present

## 2015-03-09 DIAGNOSIS — I10 Essential (primary) hypertension: Secondary | ICD-10-CM | POA: Diagnosis not present

## 2015-03-09 DIAGNOSIS — I493 Ventricular premature depolarization: Secondary | ICD-10-CM | POA: Diagnosis not present

## 2015-03-09 DIAGNOSIS — R079 Chest pain, unspecified: Secondary | ICD-10-CM | POA: Diagnosis not present

## 2015-03-09 DIAGNOSIS — E785 Hyperlipidemia, unspecified: Secondary | ICD-10-CM | POA: Diagnosis not present

## 2015-04-03 DIAGNOSIS — R197 Diarrhea, unspecified: Secondary | ICD-10-CM | POA: Diagnosis not present

## 2015-04-03 DIAGNOSIS — Z8507 Personal history of malignant neoplasm of pancreas: Secondary | ICD-10-CM | POA: Diagnosis not present

## 2015-04-03 DIAGNOSIS — R5383 Other fatigue: Secondary | ICD-10-CM | POA: Diagnosis not present

## 2015-04-15 DIAGNOSIS — K210 Gastro-esophageal reflux disease with esophagitis: Secondary | ICD-10-CM | POA: Diagnosis not present

## 2015-04-15 DIAGNOSIS — C259 Malignant neoplasm of pancreas, unspecified: Secondary | ICD-10-CM | POA: Diagnosis not present

## 2015-04-15 DIAGNOSIS — R1013 Epigastric pain: Secondary | ICD-10-CM | POA: Diagnosis not present

## 2015-04-15 DIAGNOSIS — A09 Infectious gastroenteritis and colitis, unspecified: Secondary | ICD-10-CM | POA: Diagnosis not present

## 2015-07-24 DIAGNOSIS — R1013 Epigastric pain: Secondary | ICD-10-CM | POA: Diagnosis not present

## 2015-07-24 DIAGNOSIS — K210 Gastro-esophageal reflux disease with esophagitis: Secondary | ICD-10-CM | POA: Diagnosis not present

## 2015-08-18 DIAGNOSIS — Z79899 Other long term (current) drug therapy: Secondary | ICD-10-CM | POA: Diagnosis not present

## 2015-08-18 DIAGNOSIS — M542 Cervicalgia: Secondary | ICD-10-CM | POA: Diagnosis not present

## 2015-08-18 DIAGNOSIS — I10 Essential (primary) hypertension: Secondary | ICD-10-CM | POA: Diagnosis not present

## 2015-08-18 DIAGNOSIS — R5383 Other fatigue: Secondary | ICD-10-CM | POA: Diagnosis not present

## 2015-08-18 DIAGNOSIS — R739 Hyperglycemia, unspecified: Secondary | ICD-10-CM | POA: Diagnosis not present

## 2015-08-18 DIAGNOSIS — E039 Hypothyroidism, unspecified: Secondary | ICD-10-CM | POA: Diagnosis not present

## 2015-08-19 DIAGNOSIS — E039 Hypothyroidism, unspecified: Secondary | ICD-10-CM | POA: Diagnosis not present

## 2015-08-19 DIAGNOSIS — R739 Hyperglycemia, unspecified: Secondary | ICD-10-CM | POA: Diagnosis not present

## 2015-08-19 DIAGNOSIS — Z79899 Other long term (current) drug therapy: Secondary | ICD-10-CM | POA: Diagnosis not present

## 2015-09-01 ENCOUNTER — Other Ambulatory Visit: Payer: Self-pay | Admitting: Obstetrics & Gynecology

## 2015-09-01 ENCOUNTER — Other Ambulatory Visit: Payer: Self-pay | Admitting: Physician Assistant

## 2015-09-01 DIAGNOSIS — M8580 Other specified disorders of bone density and structure, unspecified site: Secondary | ICD-10-CM

## 2015-09-01 DIAGNOSIS — Z1231 Encounter for screening mammogram for malignant neoplasm of breast: Secondary | ICD-10-CM

## 2015-09-02 DIAGNOSIS — M5481 Occipital neuralgia: Secondary | ICD-10-CM | POA: Diagnosis not present

## 2015-09-02 DIAGNOSIS — M542 Cervicalgia: Secondary | ICD-10-CM | POA: Diagnosis not present

## 2015-09-02 DIAGNOSIS — I10 Essential (primary) hypertension: Secondary | ICD-10-CM | POA: Diagnosis not present

## 2015-09-09 DIAGNOSIS — L237 Allergic contact dermatitis due to plants, except food: Secondary | ICD-10-CM | POA: Diagnosis not present

## 2015-09-18 DIAGNOSIS — L237 Allergic contact dermatitis due to plants, except food: Secondary | ICD-10-CM | POA: Diagnosis not present

## 2015-10-07 ENCOUNTER — Other Ambulatory Visit: Payer: Self-pay | Admitting: Endocrinology

## 2015-10-07 ENCOUNTER — Ambulatory Visit
Admission: RE | Admit: 2015-10-07 | Discharge: 2015-10-07 | Disposition: A | Discharge status: Home / Self Care | Payer: Medicare Other | Source: Ambulatory Visit | Attending: Physician Assistant | Admitting: Physician Assistant

## 2015-10-07 ENCOUNTER — Ambulatory Visit
Admission: RE | Admit: 2015-10-07 | Discharge: 2015-10-07 | Disposition: A | Discharge status: Home / Self Care | Payer: Medicare Other | Source: Ambulatory Visit | Attending: Obstetrics & Gynecology | Admitting: Obstetrics & Gynecology

## 2015-10-07 DIAGNOSIS — M8580 Other specified disorders of bone density and structure, unspecified site: Secondary | ICD-10-CM

## 2015-10-07 DIAGNOSIS — M8589 Other specified disorders of bone density and structure, multiple sites: Secondary | ICD-10-CM | POA: Diagnosis not present

## 2015-10-07 DIAGNOSIS — Z780 Asymptomatic menopausal state: Secondary | ICD-10-CM | POA: Diagnosis not present

## 2015-10-07 DIAGNOSIS — Z1231 Encounter for screening mammogram for malignant neoplasm of breast: Secondary | ICD-10-CM

## 2015-10-14 DIAGNOSIS — M5481 Occipital neuralgia: Secondary | ICD-10-CM | POA: Diagnosis not present

## 2015-10-15 DIAGNOSIS — Z0000 Encounter for general adult medical examination without abnormal findings: Secondary | ICD-10-CM | POA: Diagnosis not present

## 2015-10-15 DIAGNOSIS — Z1389 Encounter for screening for other disorder: Secondary | ICD-10-CM | POA: Diagnosis not present

## 2015-10-15 DIAGNOSIS — I10 Essential (primary) hypertension: Secondary | ICD-10-CM | POA: Diagnosis not present

## 2015-10-27 DIAGNOSIS — M5481 Occipital neuralgia: Secondary | ICD-10-CM | POA: Diagnosis not present

## 2015-10-27 DIAGNOSIS — M542 Cervicalgia: Secondary | ICD-10-CM | POA: Diagnosis not present

## 2015-11-04 DIAGNOSIS — M5481 Occipital neuralgia: Secondary | ICD-10-CM | POA: Diagnosis not present

## 2015-11-04 DIAGNOSIS — M542 Cervicalgia: Secondary | ICD-10-CM | POA: Diagnosis not present

## 2015-11-10 DIAGNOSIS — M542 Cervicalgia: Secondary | ICD-10-CM | POA: Diagnosis not present

## 2015-11-10 DIAGNOSIS — M5481 Occipital neuralgia: Secondary | ICD-10-CM | POA: Diagnosis not present

## 2015-11-12 DIAGNOSIS — C259 Malignant neoplasm of pancreas, unspecified: Secondary | ICD-10-CM | POA: Diagnosis not present

## 2015-11-12 DIAGNOSIS — M8580 Other specified disorders of bone density and structure, unspecified site: Secondary | ICD-10-CM | POA: Diagnosis not present

## 2015-11-12 DIAGNOSIS — E559 Vitamin D deficiency, unspecified: Secondary | ICD-10-CM | POA: Diagnosis not present

## 2015-11-12 DIAGNOSIS — R197 Diarrhea, unspecified: Secondary | ICD-10-CM | POA: Diagnosis not present

## 2015-11-16 DIAGNOSIS — R490 Dysphonia: Secondary | ICD-10-CM | POA: Diagnosis not present

## 2015-11-16 DIAGNOSIS — J310 Chronic rhinitis: Secondary | ICD-10-CM | POA: Diagnosis not present

## 2015-11-25 DIAGNOSIS — N83201 Unspecified ovarian cyst, right side: Secondary | ICD-10-CM | POA: Diagnosis not present

## 2015-11-25 DIAGNOSIS — N83202 Unspecified ovarian cyst, left side: Secondary | ICD-10-CM | POA: Diagnosis not present

## 2015-11-25 DIAGNOSIS — Z124 Encounter for screening for malignant neoplasm of cervix: Secondary | ICD-10-CM | POA: Diagnosis not present

## 2015-12-08 DIAGNOSIS — Z8507 Personal history of malignant neoplasm of pancreas: Secondary | ICD-10-CM | POA: Diagnosis not present

## 2015-12-08 DIAGNOSIS — L905 Scar conditions and fibrosis of skin: Secondary | ICD-10-CM | POA: Diagnosis not present

## 2015-12-08 DIAGNOSIS — N83201 Unspecified ovarian cyst, right side: Secondary | ICD-10-CM | POA: Diagnosis not present

## 2015-12-08 DIAGNOSIS — R197 Diarrhea, unspecified: Secondary | ICD-10-CM | POA: Diagnosis not present

## 2015-12-08 DIAGNOSIS — C250 Malignant neoplasm of head of pancreas: Secondary | ICD-10-CM | POA: Diagnosis not present

## 2015-12-08 DIAGNOSIS — K219 Gastro-esophageal reflux disease without esophagitis: Secondary | ICD-10-CM | POA: Diagnosis not present

## 2015-12-08 DIAGNOSIS — N281 Cyst of kidney, acquired: Secondary | ICD-10-CM | POA: Diagnosis not present

## 2015-12-08 DIAGNOSIS — R918 Other nonspecific abnormal finding of lung field: Secondary | ICD-10-CM | POA: Diagnosis not present

## 2015-12-08 DIAGNOSIS — R42 Dizziness and giddiness: Secondary | ICD-10-CM | POA: Diagnosis not present

## 2015-12-08 DIAGNOSIS — I2510 Atherosclerotic heart disease of native coronary artery without angina pectoris: Secondary | ICD-10-CM | POA: Diagnosis not present

## 2015-12-08 DIAGNOSIS — Z08 Encounter for follow-up examination after completed treatment for malignant neoplasm: Secondary | ICD-10-CM | POA: Diagnosis not present

## 2015-12-08 DIAGNOSIS — K5730 Diverticulosis of large intestine without perforation or abscess without bleeding: Secondary | ICD-10-CM | POA: Diagnosis not present

## 2015-12-08 DIAGNOSIS — M546 Pain in thoracic spine: Secondary | ICD-10-CM | POA: Diagnosis not present

## 2015-12-08 DIAGNOSIS — I10 Essential (primary) hypertension: Secondary | ICD-10-CM | POA: Diagnosis not present

## 2015-12-08 DIAGNOSIS — Z90411 Acquired partial absence of pancreas: Secondary | ICD-10-CM | POA: Diagnosis not present

## 2016-01-02 DIAGNOSIS — R5383 Other fatigue: Secondary | ICD-10-CM | POA: Diagnosis not present

## 2016-01-04 DIAGNOSIS — M5481 Occipital neuralgia: Secondary | ICD-10-CM | POA: Diagnosis not present

## 2016-01-04 DIAGNOSIS — M542 Cervicalgia: Secondary | ICD-10-CM | POA: Diagnosis not present

## 2016-01-11 DIAGNOSIS — M5481 Occipital neuralgia: Secondary | ICD-10-CM | POA: Diagnosis not present

## 2016-01-11 DIAGNOSIS — M542 Cervicalgia: Secondary | ICD-10-CM | POA: Diagnosis not present

## 2016-01-13 DIAGNOSIS — M542 Cervicalgia: Secondary | ICD-10-CM | POA: Diagnosis not present

## 2016-01-13 DIAGNOSIS — M5481 Occipital neuralgia: Secondary | ICD-10-CM | POA: Diagnosis not present

## 2016-01-14 DIAGNOSIS — I493 Ventricular premature depolarization: Secondary | ICD-10-CM | POA: Diagnosis not present

## 2016-01-14 DIAGNOSIS — I10 Essential (primary) hypertension: Secondary | ICD-10-CM | POA: Diagnosis not present

## 2016-01-14 DIAGNOSIS — E785 Hyperlipidemia, unspecified: Secondary | ICD-10-CM | POA: Diagnosis not present

## 2016-01-14 DIAGNOSIS — R079 Chest pain, unspecified: Secondary | ICD-10-CM | POA: Diagnosis not present

## 2016-01-14 DIAGNOSIS — Z8507 Personal history of malignant neoplasm of pancreas: Secondary | ICD-10-CM | POA: Diagnosis not present

## 2016-01-14 DIAGNOSIS — E039 Hypothyroidism, unspecified: Secondary | ICD-10-CM | POA: Diagnosis not present

## 2016-01-18 DIAGNOSIS — M542 Cervicalgia: Secondary | ICD-10-CM | POA: Diagnosis not present

## 2016-01-18 DIAGNOSIS — M5481 Occipital neuralgia: Secondary | ICD-10-CM | POA: Diagnosis not present

## 2016-01-20 DIAGNOSIS — M5481 Occipital neuralgia: Secondary | ICD-10-CM | POA: Diagnosis not present

## 2016-01-20 DIAGNOSIS — M542 Cervicalgia: Secondary | ICD-10-CM | POA: Diagnosis not present

## 2016-01-26 DIAGNOSIS — M5481 Occipital neuralgia: Secondary | ICD-10-CM | POA: Diagnosis not present

## 2016-01-26 DIAGNOSIS — M542 Cervicalgia: Secondary | ICD-10-CM | POA: Diagnosis not present

## 2016-01-28 DIAGNOSIS — M542 Cervicalgia: Secondary | ICD-10-CM | POA: Diagnosis not present

## 2016-01-28 DIAGNOSIS — M5481 Occipital neuralgia: Secondary | ICD-10-CM | POA: Diagnosis not present

## 2016-02-04 DIAGNOSIS — M7582 Other shoulder lesions, left shoulder: Secondary | ICD-10-CM | POA: Diagnosis not present

## 2016-02-04 DIAGNOSIS — M542 Cervicalgia: Secondary | ICD-10-CM | POA: Diagnosis not present

## 2016-02-04 DIAGNOSIS — M7581 Other shoulder lesions, right shoulder: Secondary | ICD-10-CM | POA: Diagnosis not present

## 2016-03-30 DIAGNOSIS — Z961 Presence of intraocular lens: Secondary | ICD-10-CM | POA: Diagnosis not present

## 2016-03-30 DIAGNOSIS — H40013 Open angle with borderline findings, low risk, bilateral: Secondary | ICD-10-CM | POA: Diagnosis not present

## 2016-03-30 DIAGNOSIS — H04123 Dry eye syndrome of bilateral lacrimal glands: Secondary | ICD-10-CM | POA: Diagnosis not present

## 2016-03-30 DIAGNOSIS — H524 Presbyopia: Secondary | ICD-10-CM | POA: Diagnosis not present

## 2016-04-18 DIAGNOSIS — H04221 Epiphora due to insufficient drainage, right lacrimal gland: Secondary | ICD-10-CM | POA: Diagnosis not present

## 2016-04-18 DIAGNOSIS — H26491 Other secondary cataract, right eye: Secondary | ICD-10-CM | POA: Diagnosis not present

## 2016-05-03 DIAGNOSIS — H26491 Other secondary cataract, right eye: Secondary | ICD-10-CM | POA: Diagnosis not present

## 2016-06-08 DIAGNOSIS — C259 Malignant neoplasm of pancreas, unspecified: Secondary | ICD-10-CM | POA: Diagnosis not present

## 2016-06-08 DIAGNOSIS — R1013 Epigastric pain: Secondary | ICD-10-CM | POA: Diagnosis not present

## 2016-06-08 DIAGNOSIS — K210 Gastro-esophageal reflux disease with esophagitis: Secondary | ICD-10-CM | POA: Diagnosis not present

## 2016-06-16 DIAGNOSIS — M4716 Other spondylosis with myelopathy, lumbar region: Secondary | ICD-10-CM | POA: Diagnosis not present

## 2016-06-24 DIAGNOSIS — M4726 Other spondylosis with radiculopathy, lumbar region: Secondary | ICD-10-CM | POA: Diagnosis not present

## 2016-06-24 DIAGNOSIS — M48061 Spinal stenosis, lumbar region without neurogenic claudication: Secondary | ICD-10-CM | POA: Diagnosis not present

## 2016-06-24 DIAGNOSIS — M5136 Other intervertebral disc degeneration, lumbar region: Secondary | ICD-10-CM | POA: Diagnosis not present

## 2016-06-24 DIAGNOSIS — M5416 Radiculopathy, lumbar region: Secondary | ICD-10-CM | POA: Diagnosis not present

## 2016-06-28 DIAGNOSIS — L570 Actinic keratosis: Secondary | ICD-10-CM | POA: Diagnosis not present

## 2016-06-28 DIAGNOSIS — L821 Other seborrheic keratosis: Secondary | ICD-10-CM | POA: Diagnosis not present

## 2016-07-01 DIAGNOSIS — I10 Essential (primary) hypertension: Secondary | ICD-10-CM | POA: Diagnosis not present

## 2016-07-01 DIAGNOSIS — G8929 Other chronic pain: Secondary | ICD-10-CM | POA: Diagnosis not present

## 2016-07-01 DIAGNOSIS — M5441 Lumbago with sciatica, right side: Secondary | ICD-10-CM | POA: Diagnosis not present

## 2016-07-01 DIAGNOSIS — E039 Hypothyroidism, unspecified: Secondary | ICD-10-CM | POA: Diagnosis not present

## 2016-09-05 ENCOUNTER — Other Ambulatory Visit: Payer: Self-pay | Admitting: Obstetrics & Gynecology

## 2016-09-05 DIAGNOSIS — Z1231 Encounter for screening mammogram for malignant neoplasm of breast: Secondary | ICD-10-CM

## 2016-09-30 DIAGNOSIS — M48061 Spinal stenosis, lumbar region without neurogenic claudication: Secondary | ICD-10-CM | POA: Diagnosis not present

## 2016-09-30 DIAGNOSIS — M5416 Radiculopathy, lumbar region: Secondary | ICD-10-CM | POA: Diagnosis not present

## 2016-09-30 DIAGNOSIS — M4726 Other spondylosis with radiculopathy, lumbar region: Secondary | ICD-10-CM | POA: Diagnosis not present

## 2016-09-30 DIAGNOSIS — M5136 Other intervertebral disc degeneration, lumbar region: Secondary | ICD-10-CM | POA: Diagnosis not present

## 2016-10-10 ENCOUNTER — Ambulatory Visit
Admission: RE | Admit: 2016-10-10 | Discharge: 2016-10-10 | Disposition: A | Discharge status: Home / Self Care | Payer: Medicare Other | Source: Ambulatory Visit | Attending: Obstetrics & Gynecology | Admitting: Obstetrics & Gynecology

## 2016-10-10 DIAGNOSIS — Z1231 Encounter for screening mammogram for malignant neoplasm of breast: Secondary | ICD-10-CM

## 2016-10-10 IMAGING — MG DIGITAL SCREENING BILATERAL MAMMOGRAM WITH CAD
4 series · 4 of 4 positions shown · non-contrast
Comparison: Previous exam(s).

CLINICAL DATA: Screening.

EXAM:
DIGITAL SCREENING BILATERAL MAMMOGRAM WITH CAD

[L CC]
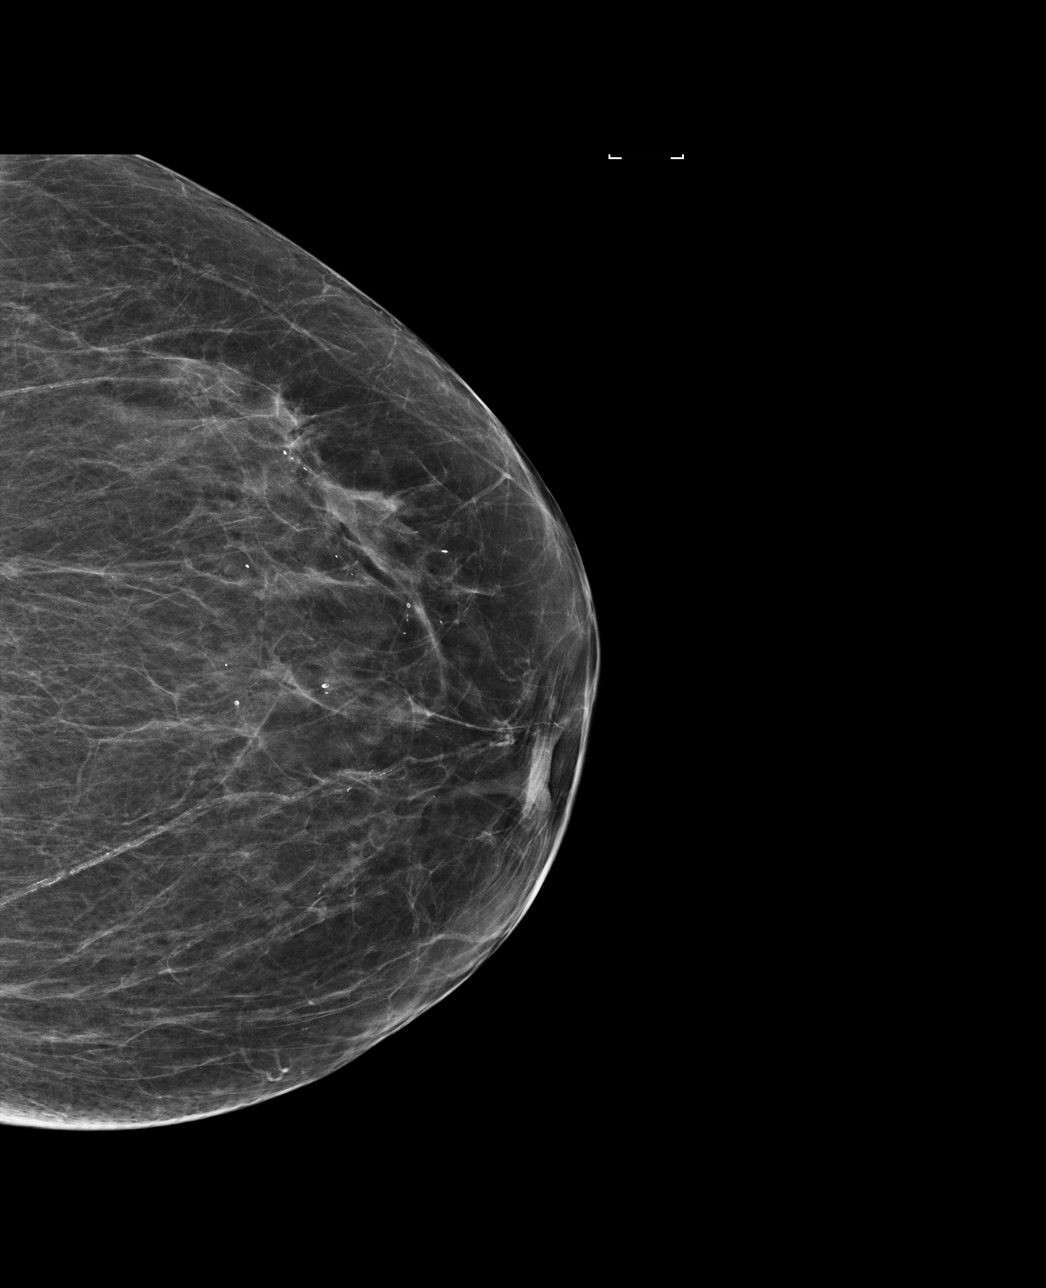

[L MLO]
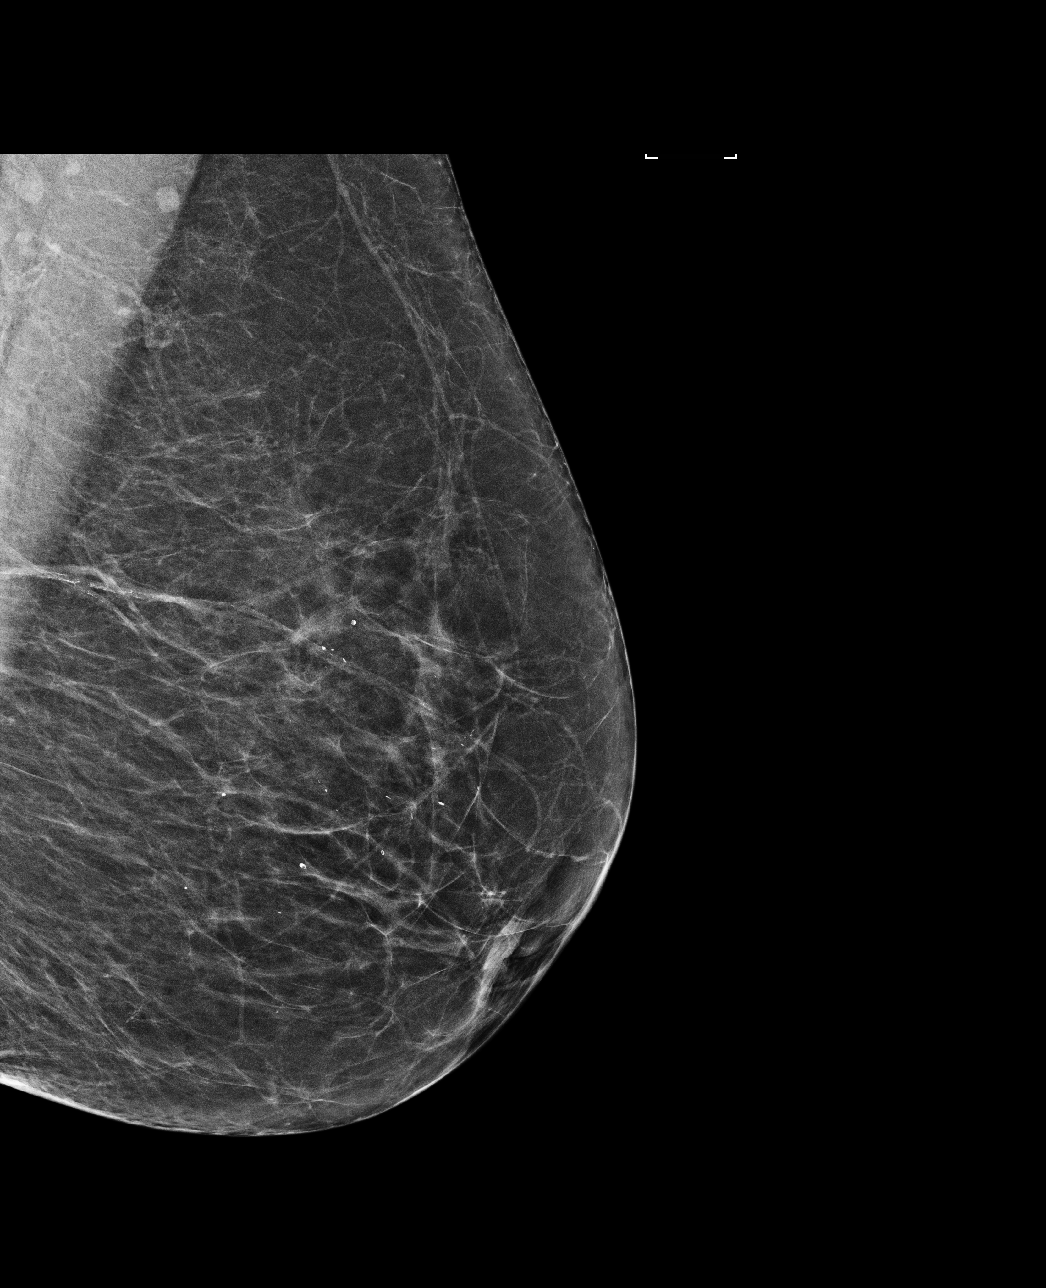

[R CC]
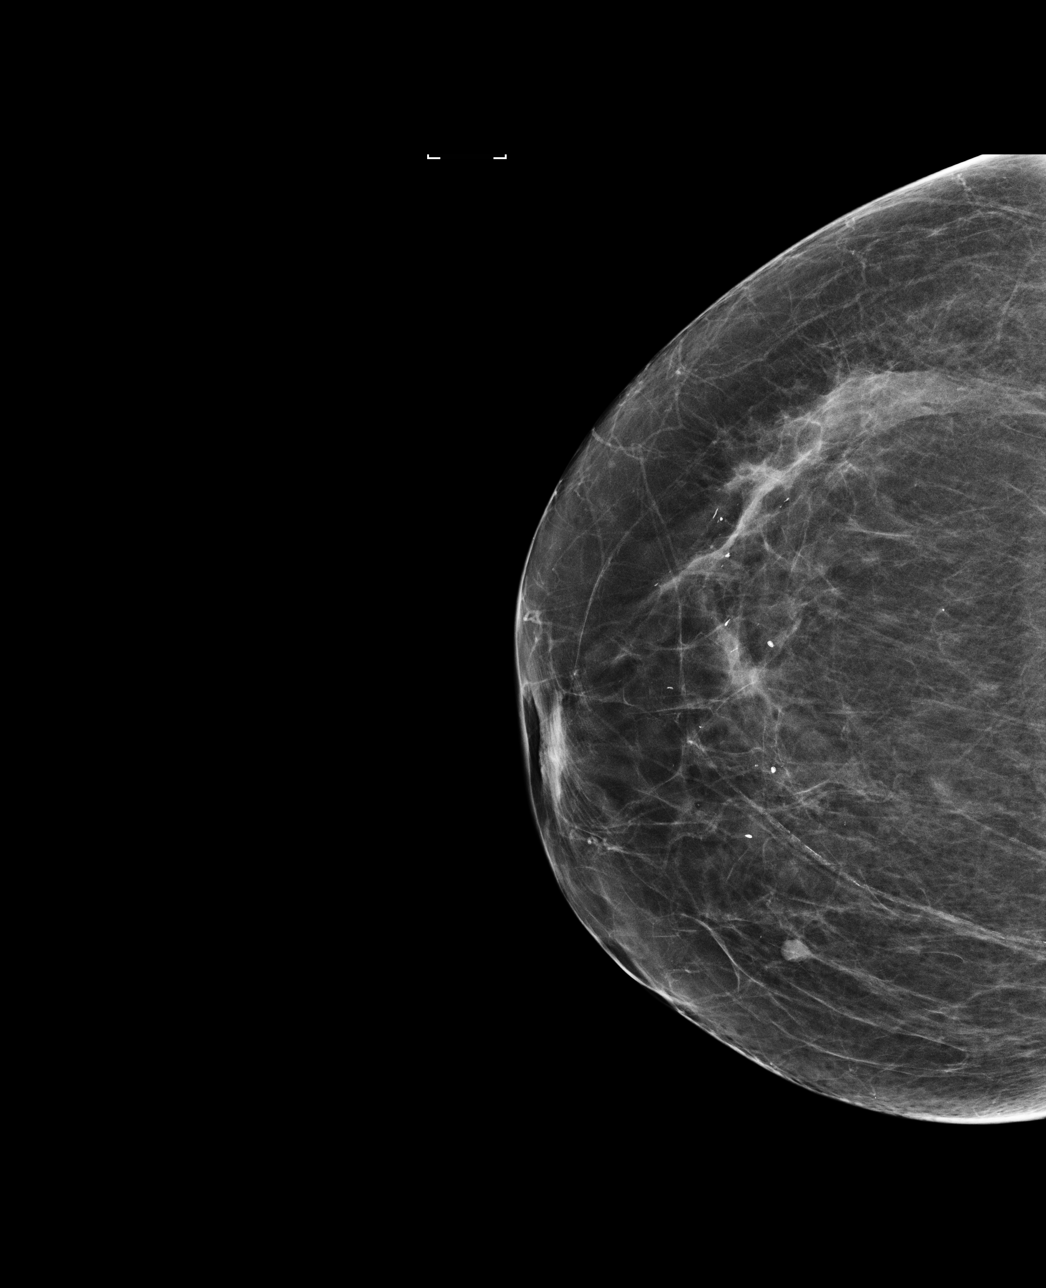

[R MLO]
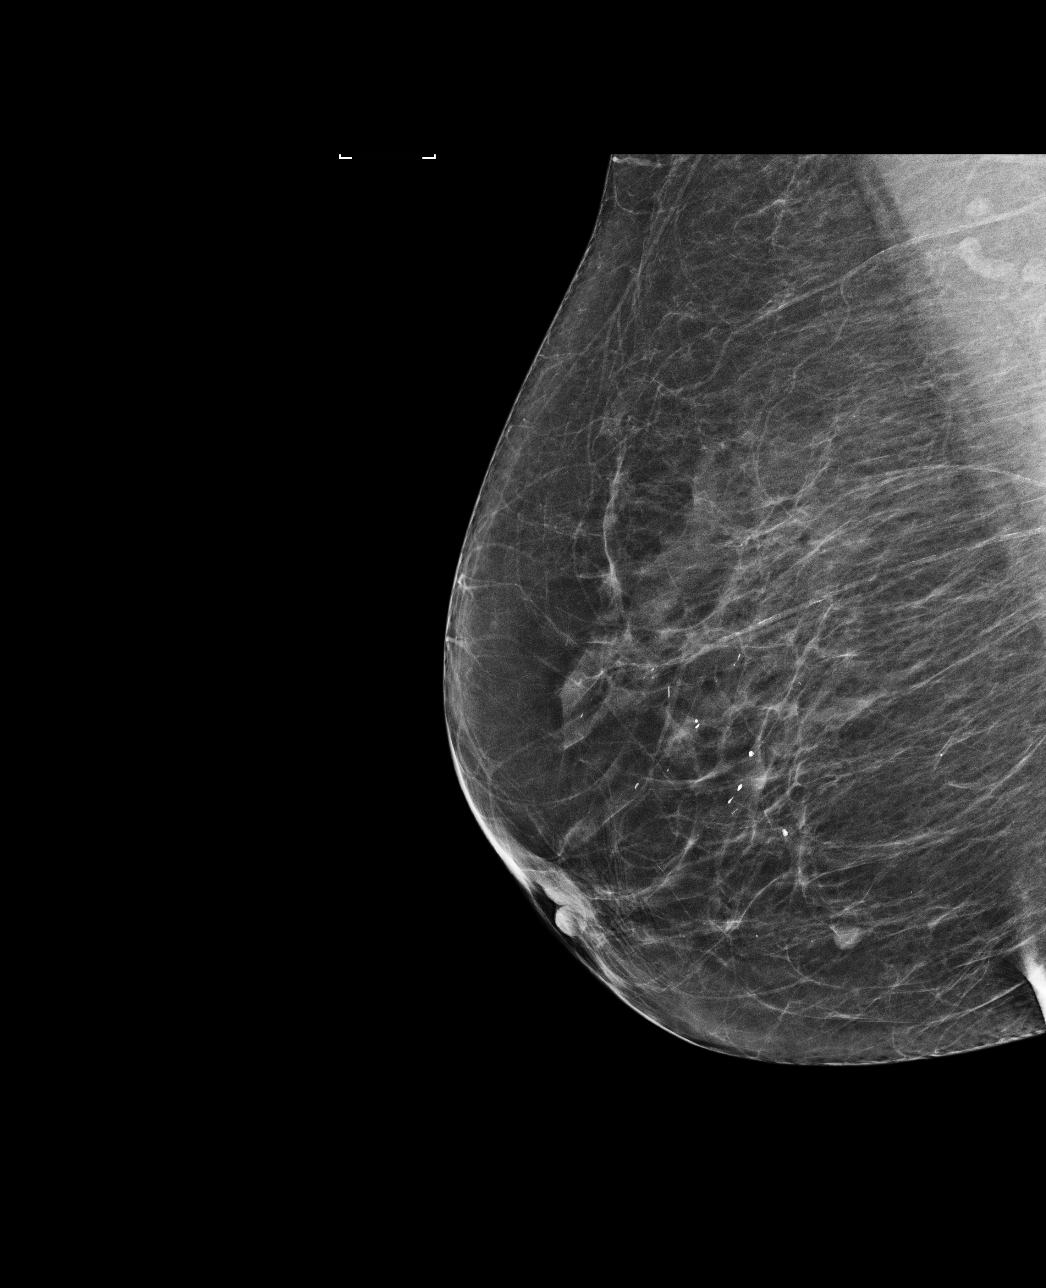

[4 of 4 positions shown; findings below may reference images not displayed]

ACR Breast Density Category b: There are scattered areas of
fibroglandular density.
FINDINGS: There are no findings suspicious for malignancy. Images were
processed with CAD.
IMPRESSION: No mammographic evidence of malignancy. A result letter of this
screening mammogram will be mailed directly to the patient.

RECOMMENDATION:
Screening mammogram in one year. (Code:[US])

BI-RADS CATEGORY  1: Negative.

## 2016-10-17 ENCOUNTER — Other Ambulatory Visit: Payer: Self-pay

## 2016-10-17 ENCOUNTER — Other Ambulatory Visit: Payer: Self-pay | Admitting: Endocrinology

## 2016-10-17 DIAGNOSIS — E039 Hypothyroidism, unspecified: Secondary | ICD-10-CM

## 2016-10-17 DIAGNOSIS — M810 Age-related osteoporosis without current pathological fracture: Secondary | ICD-10-CM

## 2016-10-20 DIAGNOSIS — Z0000 Encounter for general adult medical examination without abnormal findings: Secondary | ICD-10-CM | POA: Diagnosis not present

## 2016-10-20 DIAGNOSIS — Z1389 Encounter for screening for other disorder: Secondary | ICD-10-CM | POA: Diagnosis not present

## 2016-10-24 ENCOUNTER — Other Ambulatory Visit: Payer: Self-pay | Admitting: Endocrinology

## 2016-10-24 ENCOUNTER — Other Ambulatory Visit: Payer: Self-pay | Admitting: Physician Assistant

## 2016-10-24 DIAGNOSIS — E559 Vitamin D deficiency, unspecified: Secondary | ICD-10-CM

## 2016-10-24 DIAGNOSIS — M810 Age-related osteoporosis without current pathological fracture: Secondary | ICD-10-CM

## 2016-10-24 DIAGNOSIS — M8589 Other specified disorders of bone density and structure, multiple sites: Secondary | ICD-10-CM

## 2016-10-24 DIAGNOSIS — E039 Hypothyroidism, unspecified: Secondary | ICD-10-CM

## 2016-11-07 DIAGNOSIS — E039 Hypothyroidism, unspecified: Secondary | ICD-10-CM | POA: Diagnosis not present

## 2016-11-07 DIAGNOSIS — R7303 Prediabetes: Secondary | ICD-10-CM | POA: Diagnosis not present

## 2016-11-07 DIAGNOSIS — I10 Essential (primary) hypertension: Secondary | ICD-10-CM | POA: Diagnosis not present

## 2016-11-07 DIAGNOSIS — R269 Unspecified abnormalities of gait and mobility: Secondary | ICD-10-CM | POA: Diagnosis not present

## 2016-11-07 DIAGNOSIS — Z79899 Other long term (current) drug therapy: Secondary | ICD-10-CM | POA: Diagnosis not present

## 2016-11-10 ENCOUNTER — Other Ambulatory Visit: Payer: Medicare Other

## 2016-11-22 ENCOUNTER — Ambulatory Visit: Payer: Medicare Other | Attending: Geriatric Medicine | Admitting: Physical Therapy

## 2016-11-22 ENCOUNTER — Encounter: Payer: Self-pay | Admitting: Physical Therapy

## 2016-11-22 DIAGNOSIS — R262 Difficulty in walking, not elsewhere classified: Secondary | ICD-10-CM | POA: Diagnosis not present

## 2016-11-22 DIAGNOSIS — R2689 Other abnormalities of gait and mobility: Secondary | ICD-10-CM | POA: Diagnosis not present

## 2016-11-22 DIAGNOSIS — R278 Other lack of coordination: Secondary | ICD-10-CM | POA: Insufficient documentation

## 2016-11-22 NOTE — Therapy (Signed)
[facility] [address], [place], [place], [address] Phone: [phone]   Fax:  [phone]  Physical Therapy Evaluation  Patient Details  Name: [name] MRN: [id number] Date of Birth: 06-Jun-1932 Referring Provider: Dr. Lajean Manes  Encounter Date: 11/22/2016      PT End of Session - 11/22/16 2059    Visit Number 1   Date for PT Re-Evaluation 01/17/17   Authorization Type Medicare G codes;  KX at visit 15   PT Start Time 1530   PT Stop Time 1615   PT Time Calculation (min) 45 min   Activity Tolerance Patient tolerated treatment well      Past Medical History:  Diagnosis Date  . Hypothyroid   . Pancreatic cancer [facility])    Pancreatic  . Vitamin D deficiency     Past Surgical History:  Procedure Laterality Date  . BLADDER SUSPENSION  1999  . CATARACT EXTRACTION  2011   bilateral  . CHOLECYSTECTOMY  1992  . lower back surgery  1994, 1995   L4-5  . multiple hernias  2007, 2012   ventral  . PARTIAL HYSTERECTOMY  1973  . SHOULDER ARTHROSCOPY  2011   rt  . TONSILLECTOMY  1942  . WHIPPLE PROCEDURE  2006    There were no vitals filed for this visit.       Subjective Assessment - 11/22/16 1536    Subjective I have bad balance.  I get dizzy from looking at my [place].  It disorients me.  Reading the newspaper or a book doesn't have the same effect.  Dr. Ellene Route thinks this is all related to my C Diff.     Pertinent History had pancreatic cancer, hernia repairs and developed CDiff 2012 "I haven't recovered from that."   HTN;  Back surgery L4-5 self fusing getting ESI;  history of neck pain; right rotator cuff surgery   Limitations Walking;House hold activities;Reading   How long can you walk comfortably? short community distances only   Diagnostic tests hearing tests, Scans and PET scans at [facility] with "no significant diagnosis"     Patient Stated Goals be able to turn without losing my balance;   improve balance to lessen chance of falls;  strengthen legs   Currently in Pain? Yes   Pain Score 5    Pain Location Neck   Pain Type Chronic pain   Aggravating Factors  I don't know   Pain Relieving Factors steroids            [facility] PT Assessment - 11/22/16 0001      Assessment   Medical Diagnosis gait disorder   Referring Provider Dr. Lajean Manes   Onset Date/Surgical Date --  2012   Hand Dominance Right   Next MD Visit 01/02/17   Prior Therapy after hernia surgery     Precautions   Precautions Fall     Restrictions   Weight Bearing Restrictions No     Balance Screen   Has the patient fallen in the past 6 months Yes   How many times? 1   Has the patient had a decrease in activity level because of a fear of falling?  Yes   Is the patient reluctant to leave their home because of a fear of falling?  No     Home Environment   Living Environment Private residence   Living Arrangements Spouse/significant other   Available Help at Discharge --  husband has dementia  Type of [place] to enter   Entrance Stairs-Number of Steps 2   Entrance Stairs-Rails --  uses railing   Home Layout Two level   [place] - single point   Additional Comments doesn't use cane     Prior Function   Level of Independence Independent with basic ADLs   Vocation Retired   Leisure go outside, work in the Building services engineer,  weeding     AROM   AROM Assessment Site --  UE and LEs WFLs throughout     Strength   Right Hip Flexion 4+/5   Right Hip Extension 4/5   Right Hip ABduction 4-/5   Left Hip Flexion 4+/5   Left Hip Extension 4/5   Left Hip ABduction 4-/5   Right Knee Flexion 4+/5   Right Knee Extension 4/5   Left Knee Flexion 4+/5   Left Knee Extension 4/5   Right Ankle Dorsiflexion 4/5   Right Ankle Plantar Flexion 4/5   Left Ankle Dorsiflexion 4/5   Left Ankle Plantar Flexion 4/5     Berg Balance Test   Sit to Stand Able to stand without  using hands and stabilize independently   Standing Unsupported Able to stand safely 2 minutes   Sitting with Back Unsupported but Feet Supported on Floor or Stool Able to sit safely and securely 2 minutes   Stand to Sit Sits safely with minimal use of hands   Transfers Able to transfer safely, minor use of hands   Standing Unsupported with Eyes Closed Able to stand 10 seconds with supervision   Standing Ubsupported with Feet Together Able to place feet together independently but unable to hold for 30 seconds   From Standing, Reach Forward with Outstretched Arm Can reach forward >5 cm safely (2")   From Standing Position, Pick up Object from Floor Able to pick up shoe, needs supervision   From Standing Position, Turn to Look Behind Over each Shoulder Looks behind one side only/other side shows less weight shift   Turn 360 Degrees Able to turn 360 degrees safely but slowly   Standing Unsupported, Alternately Place Feet on Step/Stool Able to complete >2 steps/needs minimal assist   Standing Unsupported, One Foot in Front Able to take small step independently and hold 30 seconds   Standing on One Leg Tries to lift leg/unable to hold 3 seconds but remains standing independently   Total Score 39     Dynamic Gait Index   Level Surface Normal   Change in Gait Speed Mild Impairment   Gait with Horizontal Head Turns Mild Impairment   Gait with Vertical Head Turns Mild Impairment   Gait and Pivot Turn Mild Impairment   Step Over Obstacle Mild Impairment   Step Around Obstacles Mild Impairment   Steps Mild Impairment   Total Score 17     Timed Up and Go Test   Normal TUG (seconds) 10.7            Objective measurements completed on examination: See above findings.                    PT Short Term Goals - 11/22/16 2116      PT SHORT TERM GOAL #1   Title The patient will have knowledge of initial HEP needed for balance and LE strengthening   Time 4   Period Weeks    Status New   Target Date 12/20/16     PT  SHORT TERM GOAL #2   Title The patient will have improved BERG balance score to 42/56 indicating decreased risk for falls   Time 4   Period Weeks   Status New     PT SHORT TERM GOAL #3   Title Dynamic Gait Index improved to 19 indicating improved dynamic gait   Time 4   Period Weeks   Status New           PT Long Term Goals - 11/22/16 2128      PT LONG TERM GOAL #1   Title The patient will be independent in safe self progression of HEP for further improvements in balance   Time 8   Period Weeks   Status New   Target Date 01/17/17     PT LONG TERM GOAL #2   Title BERG balance score improved to 45/56 indicating decreased risk of falls   Time 8   Period Weeks   Status New     PT LONG TERM GOAL #3   Title Dynamic Gait Index improved to 21 indicating improved gait stability   Time 8   Period Weeks   Status New     PT LONG TERM GOAL #4   Title LE strength grossly 4+/5 needed for safety with standing and walking   Time 8   Period Weeks   Status New     PT LONG TERM GOAL #5   Title ABC Activities Balance Confidence score with improved by 10 or 70% or greater confidence level   Time 8   Period Weeks   Status New                Plan - 11/22/16 2100    Clinical Impression Statement The patient reports a worsening of her balance over time.   She attributes her problem as the result of being very sick a few years ago with a C Diff infection.    She reports she feels unsteady when she changes directions and has to be cautious with turns.  She also says she is dizzy with using her smartphone and the computer.  ROM of UE and LE WFLs throughout.  Strength in LEs grossly 4/5 and hip abduction 4-/5 bilaterally.  Deficits with BERG balance test 39/56 indicating high risk for falls.  Discussed with patient recommendation to use a cane in the community for safety.  Dynamic Gait Index with minimal disturbances with a score of 17.   Timed up and Go test 10.7 sec and WNLs for age.  She would benefit from PT to address these deficits.     History and Personal Factors relevant to plan of care: numerous co-morbidities including hx of pancreatic cancer, chronic neck and back pain in which patient receives ESI, HTN; lack of home support (her husband has dementia);  rotator cuff surgery; advanced age   Clinical Presentation Evolving   Clinical Presentation due to: balance worsening over time with fall in the last 6 months   Clinical Decision Making Moderate   Rehab Potential Good   Clinical Impairments Affecting Rehab Potential risk of falls;  main focus of treatment on balance although patient may need pain management on neck and back if affects ability to participate in balance ex   PT Frequency 2x / week   PT Duration 8 weeks   PT Treatment/Interventions ADLs/Self Care Home Management;Neuromuscular re-education;Therapeutic exercise;Balance training;Therapeutic activities;Moist Heat;Electrical Stimulation;Cryotherapy;Patient/family education;Manual techniques   PT Next Visit Plan Nu-Step;  standing balance ex;  leg press  for LE strengthening;  weight shifting on mini trampoline;   Give ABC scale       Patient will benefit from skilled therapeutic intervention in order to improve the following deficits and impairments:  Decreased balance, Pain, Decreased strength, Abnormal gait  Visit Diagnosis: Other abnormalities of gait and mobility - Plan: PT plan of care cert/re-cert  Other lack of coordination - Plan: PT plan of care cert/re-cert  Difficulty in walking, not elsewhere classified - Plan: PT plan of care cert/re-cert      G-Codes - 80/22/33 2134    Functional Assessment Tool Used (Outpatient Only) Clinical impression; BERG   Functional Limitation Mobility: Walking and moving around   Mobility: Walking and Moving Around Current Status ([id number]) At least 40 percent but less than 60 percent impaired, limited or restricted    Mobility: Walking and Moving Around Goal Status [phone]) At least 20 percent but less than 40 percent impaired, limited or restricted       Problem List There are no active problems to display for this patient.  Ruben Im, PT 11/22/16 9:36 PM Phone: [phone] Fax: [phone]  [name] 11/22/2016, 9:36 PM  [facility] [address] [place], [place], [address] Phone: [phone]   Fax:  [phone]  Name: [name] MRN: [id number] Date of Birth: 1932-02-25

## 2016-12-05 ENCOUNTER — Encounter: Payer: Self-pay | Admitting: Physical Therapy

## 2016-12-05 ENCOUNTER — Ambulatory Visit: Payer: Medicare Other | Attending: Geriatric Medicine | Admitting: Physical Therapy

## 2016-12-05 DIAGNOSIS — R262 Difficulty in walking, not elsewhere classified: Secondary | ICD-10-CM | POA: Diagnosis not present

## 2016-12-05 DIAGNOSIS — R2689 Other abnormalities of gait and mobility: Secondary | ICD-10-CM | POA: Diagnosis not present

## 2016-12-05 DIAGNOSIS — R278 Other lack of coordination: Secondary | ICD-10-CM | POA: Insufficient documentation

## 2016-12-05 NOTE — Patient Instructions (Signed)
   LONG ARC QUAD - LAQ - HIGH SEAT  While seated with your knee in a bent position, slowly straighten your knee as you raise your foot upwards as shown.  Use 1-2 lb ankle weights - Do 2 sets of 10    SEATED MARCHING - ELASTIC BAND  Start by sitting in a chair with an elastic band wrapped around your lower thighs.  Next, move a knee upward, set it back down and then alternate to the other side.  March 20x each leg    Seated Hip abduction with TB  Start in sitting with good posture. Activate core stabilizing muscles to protect lumbar spine. Bring knees together and wrap an elastic exercise band around them. Press the knees outward, activating the  hip stabilizers. Hold, then slowly return to starting position. Repeat 20x     HIP ABDUCTION - STANDING   While standing, raise your leg out to the side. Keep your knee straight and maintain your toes pointed forward the entire time.    Use your arms for support if needed for balance and safety. 10x each side    HIP FLEXION - STANDING - SLR  While standing on one leg, lift your other leg forward with a straight knee as shown. Return to starting position and repeat.   Use your arms for support if needed for balance and safety.   10x each  [facility] [address], [place], [place] [address] Phone # [phone] Fax [phone]

## 2016-12-05 NOTE — Therapy (Signed)
[facility] [address] [name], [place], [address] Phone: [phone]   Fax:  [phone]  Physical Therapy Treatment  Patient Details  Name: [name] MRN: [id number] Date of Birth: [birth year] Referring Provider: Dr. Lajean Manes   Encounter Date: 12/05/2016  PT End of Session - 12/05/16 1400    Visit Number  2    Number of Visits  10    Date for PT Re-Evaluation  01/17/17    Authorization Type  Medicare G codes;  KX at visit 15    PT Start Time  1400    PT Stop Time  1445    PT Time Calculation (min)  45 min    Activity Tolerance  Patient tolerated treatment well       Past Medical History:  Diagnosis Date  . Hypothyroid   . Pancreatic cancer [facility])    Pancreatic  . Vitamin D deficiency     Past Surgical History:  Procedure Laterality Date  . BLADDER SUSPENSION  1999  . CATARACT EXTRACTION  2011   bilateral  . CHOLECYSTECTOMY  1992  . lower back surgery  1994, 1995   L4-5  . multiple hernias  2007, 2012   ventral  . PARTIAL HYSTERECTOMY  1973  . SHOULDER ARTHROSCOPY  2011   rt  . TONSILLECTOMY  1942  . WHIPPLE PROCEDURE  2006    There were no vitals filed for this visit.  Subjective Assessment - 12/05/16 1450    Subjective  I have been running around a lot today because I have my children coming from [place] today.  Pt reports no changes since initial visit.    Pertinent History  had pancreatic cancer, hernia repairs and developed CDiff 2012 "I haven't recovered from that."   HTN;  Back surgery L4-5 self fusing getting ESI;  history of neck pain; right rotator cuff surgery    Limitations  Walking;House hold activities;Reading    Patient Stated Goals  be able to turn without losing my balance;  improve balance to lessen chance of falls;  strengthen legs    Currently in Pain?  No/denies                      [facility] Adult PT Treatment/Exercise - 12/05/16 0001      Neuro  Re-ed    Neuro Re-ed Details   standing with pertubation and holding with one hand;       Knee/Hip Exercises: Aerobic   Nustep  L2 x 6 min seat 7; no UE   seat 7; no UE     Knee/Hip Exercises: Machines for Strengthening   Cybex Leg Press  bilateral 60# 2x10; single leg 15# 2x10      Knee/Hip Exercises: Standing   Heel Raises  Both;20 reps    Hip Flexion  Stengthening;Right;Left;Knee bent;20 reps one finger for balance; cues for posture   one finger for balance; cues for posture   Rebounder  weight shifting 3 ways x 1 min each    Other Standing Knee Exercises  weight shifting toe to heel - min to no UE support3 min      Knee/Hip Exercises: Seated   Long Arc Quad  Strengthening;Right;Left;10 reps 1.5 lb   1.5 lb   Knee/Hip Flexion  2x10 1.5#    Other Seated Knee/Hip Exercises  rocker board - 20x DF/PF    Abduction/Adduction   Strengthening;Both;20 reps green    green  PT Education - 12/05/16 1444    Education provided  Yes    Education Details  initial HEP: seated ex, standing hip     Person(s) Educated  Patient    Methods  Explanation;Handout;Demonstration;Verbal cues    Comprehension  Verbalized understanding;Returned demonstration       PT Short Term Goals - 11/22/16 2116      PT SHORT TERM GOAL #1   Title  The patient will have knowledge of initial HEP needed for balance and LE strengthening    Time  4    Period  Weeks    Status  New    Target Date  12/20/16      PT SHORT TERM GOAL #2   Title  The patient will have improved BERG balance score to 42/56 indicating decreased risk for falls    Time  4    Period  Weeks    Status  New      PT SHORT TERM GOAL #3   Title  Dynamic Gait Index improved to 19 indicating improved dynamic gait    Time  4    Period  Weeks    Status  New        PT Long Term Goals - 11/22/16 2128      PT LONG TERM GOAL #1   Title  The patient will be independent in safe self progression of HEP for further improvements  in balance    Time  8    Period  Weeks    Status  New    Target Date  01/17/17      PT LONG TERM GOAL #2   Title  BERG balance score improved to 45/56 indicating decreased risk of falls    Time  8    Period  Weeks    Status  New      PT LONG TERM GOAL #3   Title  Dynamic Gait Index improved to 21 indicating improved gait stability    Time  8    Period  Weeks    Status  New      PT LONG TERM GOAL #4   Title  LE strength grossly 4+/5 needed for safety with standing and walking    Time  8    Period  Weeks    Status  New      PT LONG TERM GOAL #5   Title  ABC Activities Balance Confidence score with improved by 10 or 70% or greater confidence level    Time  8    Period  Weeks    Status  New            Plan - 12/05/16 1401    Clinical Impression Statement  Patient did well with exercises today.  She needed cues to slow down especially standing exercises due to feeling like it was harder to balance when going slower.  pt demonstrates decreased balance and LE strength and will benefit from skilled PT to work towards functional goals.  No goals met today due to initial treatment since eval.    Rehab Potential  Good    Clinical Impairments Affecting Rehab Potential  risk of falls;  main focus of treatment on balance although patient may need pain management on neck and back if affects ability to participate in balance ex    PT Treatment/Interventions  ADLs/Self Care Home Management;Neuromuscular re-education;Therapeutic exercise;Balance training;Therapeutic activities;Moist Heat;Electrical Stimulation;Cryotherapy;Patient/family education;Manual techniques       Patient will benefit from skilled  therapeutic intervention in order to improve the following deficits and impairments:  Decreased balance, Pain, Decreased strength, Abnormal gait  Visit Diagnosis: Other abnormalities of gait and mobility  Other lack of coordination  Difficulty in walking, not elsewhere  classified     Problem List There are no active problems to display for this patient.   Zannie Cove, PT 12/05/2016, 2:51 PM  [facility] [address], [place], [place], [address] Phone: [phone]   Fax:  [phone]  Name: [name] MRN: [id number] Date of Birth: 05/29/1932

## 2016-12-06 DIAGNOSIS — N83201 Unspecified ovarian cyst, right side: Secondary | ICD-10-CM | POA: Diagnosis not present

## 2016-12-06 DIAGNOSIS — Z79899 Other long term (current) drug therapy: Secondary | ICD-10-CM | POA: Diagnosis not present

## 2016-12-06 DIAGNOSIS — C250 Malignant neoplasm of head of pancreas: Secondary | ICD-10-CM | POA: Diagnosis not present

## 2016-12-08 ENCOUNTER — Ambulatory Visit: Payer: Medicare Other | Admitting: Physical Therapy

## 2016-12-08 DIAGNOSIS — R262 Difficulty in walking, not elsewhere classified: Secondary | ICD-10-CM

## 2016-12-08 DIAGNOSIS — R278 Other lack of coordination: Secondary | ICD-10-CM | POA: Diagnosis not present

## 2016-12-08 DIAGNOSIS — R2689 Other abnormalities of gait and mobility: Secondary | ICD-10-CM

## 2016-12-08 NOTE — Therapy (Addendum)
[facility] [address], [place], [place], [address] Phone: [phone]   Fax:  [phone]  Physical Therapy Treatment/Discharge  Patient Details  Name: [name] MRN: [id number] Date of Birth: 02/07/1932 Referring Provider: Dr. Lajean Manes   Encounter Date: 12/08/2016  PT End of Session - 12/08/16 1542    Visit Number  3    Number of Visits  10    Date for PT Re-Evaluation  01/17/17    Authorization Type  Medicare G codes;  KX at visit 15    PT Start Time  1533    PT Stop Time  1618    PT Time Calculation (min)  45 min    Activity Tolerance  Patient tolerated treatment well;No increased pain    Behavior During Therapy  WFL for tasks assessed/performed       Past Medical History:  Diagnosis Date  . Hypothyroid   . Pancreatic cancer [facility])    Pancreatic  . Vitamin D deficiency     Past Surgical History:  Procedure Laterality Date  . BLADDER SUSPENSION  1999  . CATARACT EXTRACTION  2011   bilateral  . CHOLECYSTECTOMY  1992  . lower back surgery  1994, 1995   L4-5  . multiple hernias  2007, 2012   ventral  . PARTIAL HYSTERECTOMY  1973  . SHOULDER ARTHROSCOPY  2011   rt  . TONSILLECTOMY  1942  . WHIPPLE PROCEDURE  2006    There were no vitals filed for this visit.  Subjective Assessment - 12/08/16 1536    Subjective  Pt feels somewhat concerned about her sessions and possibly increasing her low back pain. She did have some increase in her low back pain following last session, but now it is fine.     Pertinent History  had pancreatic cancer, hernia repairs and developed CDiff 2012 "I haven't recovered from that."   HTN;  Back surgery L4-5 self fusing getting ESI;  history of neck pain; right rotator cuff surgery    Limitations  Walking;House hold activities;Reading    Patient Stated Goals  be able to turn without losing my balance;  improve balance to lessen chance of falls;  strengthen legs    Currently in Pain?  No/denies                      [facility] Adult PT Treatment/Exercise - 12/08/16 0001      Knee/Hip Exercises: Standing   Wall Squat  2 sets;10 reps;Other (comment) cues to keep bottom against wall       Knee/Hip Exercises: Seated   Hamstring Curl  Both;2 sets;10 reps    Hamstring Limitations  red TB       Knee/Hip Exercises: Supine   Other Supine Knee/Hip Exercises  single leg clams with green TB           Balance Exercises - 12/08/16 1555      Balance Exercises: Standing   Standing Eyes Opened  Foam/compliant surface;2 reps;30 secs;Other (comment);Narrow base of support (BOS) trunk rotation Lt/Rt x10 reps each     Standing Eyes Closed  Narrow base of support (BOS);2 reps;30 secs    Tandem Stance  2 reps;30 secs;Eyes open    SLS  3 reps;15 secs;Eyes open        PT Education - 12/08/16 1544    Education provided  Yes    Education Details  answered pt concerns regarding her low back and participation  in therapy; discussed ways we can adjust her positioning/exercises during her sessions to decrease strain on the low back.     Person(s) Educated  Patient    Methods  Explanation    Comprehension  Verbalized understanding       PT Short Term Goals - 11/22/16 2116      PT SHORT TERM GOAL #1   Title  The patient will have knowledge of initial HEP needed for balance and LE strengthening    Time  4    Period  Weeks    Status  New    Target Date  12/20/16      PT SHORT TERM GOAL #2   Title  The patient will have improved BERG balance score to 42/56 indicating decreased risk for falls    Time  4    Period  Weeks    Status  New      PT SHORT TERM GOAL #3   Title  Dynamic Gait Index improved to 19 indicating improved dynamic gait    Time  4    Period  Weeks    Status  New        PT Long Term Goals - 11/22/16 2128      PT LONG TERM GOAL #1   Title  The patient will be independent in safe self progression of HEP for further  improvements in balance    Time  8    Period  Weeks    Status  New    Target Date  01/17/17      PT LONG TERM GOAL #2   Title  BERG balance score improved to 45/56 indicating decreased risk of falls    Time  8    Period  Weeks    Status  New      PT LONG TERM GOAL #3   Title  Dynamic Gait Index improved to 21 indicating improved gait stability    Time  8    Period  Weeks    Status  New      PT LONG TERM GOAL #4   Title  LE strength grossly 4+/5 needed for safety with standing and walking    Time  8    Period  Weeks    Status  New      PT LONG TERM GOAL #5   Title  ABC Activities Balance Confidence score with improved by 10 or 70% or greater confidence level    Time  8    Period  Weeks    Status  New            Plan - 12/08/16 1616    Clinical Impression Statement  Pt arrived with concerns regarding increase in low back pain following her last session. She reports no pain currently, and therapist reassured her that there are various positions and adjustments that we can make to avoid exacerbating her low back pain. Completed LE strengthening and standing balance with noted improvements in single leg stance up to atleast 15 sec without LOB. Therapist encouraged pt to complete more of her HEP and made an addition to her program. Pt verbalized understanding of everything discussed and reported no low back irritation at this time.     Rehab Potential  Good    Clinical Impairments Affecting Rehab Potential  risk of falls;  main focus of treatment on balance although patient may need pain management on neck and back if affects ability to participate in balance ex  PT Treatment/Interventions  ADLs/Self Care Home Management;Neuromuscular re-education;Therapeutic exercise;Balance training;Therapeutic activities;Moist Heat;Electrical Stimulation;Cryotherapy;Patient/family education;Manual techniques    PT Next Visit Plan  Nu-Step;  standing balance ex;  leg press for LE strengthening  if able without increase in LBP;  weight shifting on mini trampoline;   Give ABC scale     PT Home Exercise Plan  wall squats     Consulted and Agree with Plan of Care  Patient       Patient will benefit from skilled therapeutic intervention in order to improve the following deficits and impairments:  Decreased balance, Pain, Decreased strength, Abnormal gait  Visit Diagnosis: Other abnormalities of gait and mobility  Other lack of coordination  Difficulty in walking, not elsewhere classified     Problem List There are no active problems to display for this patient.    4:56 PM,12/08/16 Elly Modena PT, [place] [facility] [place]  [place] [address] [place], [place], [address] Phone: [phone]   Fax:  [phone]  Name: [name] MRN: [id number] Date of Birth: 1932-05-02  *Addendum to resolve episode of care and d/c pt from PT  [place]  Visits from Start of Care: 3  Current functional level related to goals / functional outcomes: See above for more details    Remaining deficits: See above for more details    Education / Equipment: See above for more details  Plan: Patient agrees to discharge.  Patient goals were not met. Patient is being discharged due to a change in medical status.  ?????     Pt cancelled all appointments due to her and her husband's health issues.   4:17 PM,02/20/17 Sherol Dade PT, [place] [facility] [place]

## 2016-12-08 NOTE — Patient Instructions (Signed)
  WALL SQUATS  Leaning up against a wall or closed door on your back, slide your body downward and then return back to upright position.  A door was used here because it was smoother and had less friction than the wall.   Knees should bend in line with the 2nd toe and not pass the front of the foot. 2x10 reps.  Keep back and hips against the wall.   [place] [address], [place], [place] [address] Phone # [phone] Fax [phone]

## 2016-12-12 ENCOUNTER — Encounter: Payer: Medicare Other | Admitting: Physical Therapy

## 2016-12-14 ENCOUNTER — Encounter: Payer: Medicare Other | Admitting: Physical Therapy

## 2016-12-15 ENCOUNTER — Encounter: Payer: Medicare Other | Admitting: Physical Therapy

## 2016-12-20 ENCOUNTER — Encounter: Payer: Medicare Other | Admitting: Physical Therapy

## 2016-12-26 ENCOUNTER — Encounter: Payer: Medicare Other | Admitting: Physical Therapy

## 2016-12-28 ENCOUNTER — Encounter: Payer: Medicare Other | Admitting: Physical Therapy

## 2016-12-29 ENCOUNTER — Encounter: Payer: Medicare Other | Admitting: Physical Therapy

## 2016-12-30 DIAGNOSIS — M5416 Radiculopathy, lumbar region: Secondary | ICD-10-CM | POA: Diagnosis not present

## 2016-12-30 DIAGNOSIS — M48061 Spinal stenosis, lumbar region without neurogenic claudication: Secondary | ICD-10-CM | POA: Diagnosis not present

## 2016-12-30 DIAGNOSIS — M5136 Other intervertebral disc degeneration, lumbar region: Secondary | ICD-10-CM | POA: Diagnosis not present

## 2016-12-30 DIAGNOSIS — M4726 Other spondylosis with radiculopathy, lumbar region: Secondary | ICD-10-CM | POA: Diagnosis not present

## 2017-01-02 DIAGNOSIS — M5441 Lumbago with sciatica, right side: Secondary | ICD-10-CM | POA: Diagnosis not present

## 2017-01-02 DIAGNOSIS — Z23 Encounter for immunization: Secondary | ICD-10-CM | POA: Diagnosis not present

## 2017-01-02 DIAGNOSIS — Z0000 Encounter for general adult medical examination without abnormal findings: Secondary | ICD-10-CM | POA: Diagnosis not present

## 2017-01-02 DIAGNOSIS — I10 Essential (primary) hypertension: Secondary | ICD-10-CM | POA: Diagnosis not present

## 2017-01-03 ENCOUNTER — Encounter: Payer: Medicare Other | Admitting: Physical Therapy

## 2017-01-05 ENCOUNTER — Encounter: Payer: Medicare Other | Admitting: Physical Therapy

## 2017-01-09 ENCOUNTER — Encounter: Payer: Medicare Other | Admitting: Physical Therapy

## 2017-01-11 ENCOUNTER — Encounter: Payer: Medicare Other | Admitting: Physical Therapy

## 2017-01-16 ENCOUNTER — Encounter: Payer: Medicare Other | Admitting: Physical Therapy

## 2017-02-08 DIAGNOSIS — Z961 Presence of intraocular lens: Secondary | ICD-10-CM | POA: Diagnosis not present

## 2017-02-08 DIAGNOSIS — H52203 Unspecified astigmatism, bilateral: Secondary | ICD-10-CM | POA: Diagnosis not present

## 2017-02-08 DIAGNOSIS — H524 Presbyopia: Secondary | ICD-10-CM | POA: Diagnosis not present

## 2017-02-09 ENCOUNTER — Ambulatory Visit (INDEPENDENT_AMBULATORY_CARE_PROVIDER_SITE_OTHER): Payer: Medicare Other | Admitting: Podiatry

## 2017-02-09 ENCOUNTER — Encounter: Payer: Self-pay | Admitting: Podiatry

## 2017-02-09 ENCOUNTER — Ambulatory Visit (INDEPENDENT_AMBULATORY_CARE_PROVIDER_SITE_OTHER): Payer: Medicare Other

## 2017-02-09 DIAGNOSIS — M21619 Bunion of unspecified foot: Secondary | ICD-10-CM

## 2017-02-09 DIAGNOSIS — Q828 Other specified congenital malformations of skin: Secondary | ICD-10-CM | POA: Diagnosis not present

## 2017-02-09 DIAGNOSIS — M79671 Pain in right foot: Secondary | ICD-10-CM

## 2017-02-10 DIAGNOSIS — A09 Infectious gastroenteritis and colitis, unspecified: Secondary | ICD-10-CM | POA: Diagnosis not present

## 2017-02-10 DIAGNOSIS — R634 Abnormal weight loss: Secondary | ICD-10-CM | POA: Diagnosis not present

## 2017-02-10 DIAGNOSIS — R630 Anorexia: Secondary | ICD-10-CM | POA: Diagnosis not present

## 2017-02-12 NOTE — Progress Notes (Signed)
Subjective:   Patient ID: [name], female   DOB: 84 y.o.   MRN: [id number]   HPI Hania presents the office today for concerns of a problem to her right foot as well as a corn on her second third toe as well as a callus in the bottom of her fifth toe.  She has been putting pads in the area which is helpful but the area does come back.  She states that the callus on the right foot is been there for over 10 years and the bunion is also been going on for over 10 years.  She states that due to her other medical conditions she is kind of neglected her feet.  She denies any swelling or redness or any drainage from the callus sites.  She said no recent treatment.  She has no other concerns.  Review of Systems  All other systems reviewed and are negative.  Past Medical History:  Diagnosis Date  . Hypothyroid   . Pancreatic cancer [facility])    Pancreatic  . Vitamin D deficiency     Past Surgical History:  Procedure Laterality Date  . BLADDER SUSPENSION  1999  . CATARACT EXTRACTION  2011   bilateral  . CHOLECYSTECTOMY  1992  . lower back surgery  1994, 1995   L4-5  . multiple hernias  2007, 2012   ventral  . PARTIAL HYSTERECTOMY  1973  . SHOULDER ARTHROSCOPY  2011   rt  . TONSILLECTOMY  1942  . WHIPPLE PROCEDURE  2006     Current Outpatient Medications:  .  levothyroxine (SYNTHROID, LEVOTHROID) 50 MCG tablet, Take 50 mcg by mouth daily., Disp: , Rfl:  .  lipase/protease/amylase (CREON) 12000 UNITS CPEP capsule, Take 24,000 Units by mouth daily. 3-4 tabs per meal, 2 tabs per snacks, Disp: , Rfl:  .  Multiple Vitamins-Minerals (COMPLETE MULTIVITAMIN/MINERAL PO), Take 1 tablet by mouth daily., Disp: , Rfl:  .  pantoprazole (PROTONIX) 40 MG tablet, Take 40 mg by mouth as needed. , Disp: , Rfl:  .  Vitamin D, Ergocalciferol, (DRISDOL) 50000 UNITS CAPS, Take 50,000 Units by mouth. Every Wednesday and Saturday, Disp: , Rfl:   Allergies  Allergen Reactions  . Ivp Dye [Iodinated Diagnostic  Agents] Anaphylaxis    Pet scan Gastrografin per patient.Irven Baltimore, RN  . Amoxicillin   . Ciprofloxacin   . Dextromethorphan Polistirex Er   . Diatrizoate Meglumine & Sodium   . Flagyl [Metronidazole]   . Hydrocodone   . Latex   . Methocarbamol   . Oxycodone   . Prednisone   . Promethazine Hcl   . Tape     Social History   Socioeconomic History  . Marital status: Married    Spouse name: Not on file  . Number of children: Not on file  . Years of education: Not on file  . Highest education level: Not on file  Social Needs  . Financial resource strain: Not on file  . Food insecurity - worry: Not on file  . Food insecurity - inability: Not on file  . Transportation needs - medical: Not on file  . Transportation needs - non-medical: Not on file  Occupational History  . Not on file  Tobacco Use  . Smoking status: Never Smoker  . Smokeless tobacco: Never Used  Substance and Sexual Activity  . Alcohol use: Yes  . Drug use: Not on file  . Sexual activity: Not on file  Other Topics Concern  . Not on file  Social History Narrative  . Not on file       Objective:  Physical Exam  General: AAO x3, NAD  Dermatological: Hyperkeratotic lesions present right second and third toe as well as left submetatarsal 5 bilaterally.  Upon debridement there is no underlying ulceration, drainage or any signs of infection noted.  There is no other open lesions or pre-ulcerative lesion identified today.  Vascular: Dorsalis Pedis artery and Posterior Tibial artery pedal pulses are 2/4 bilateral with immedate capillary fill time. Pedal hair growth present. There is no pain with calf compression, swelling, warmth, erythema.   Neruologic: Grossly intact via light touch bilateral. Vibratory intact via tuning fork bilateral. Protective threshold with Semmes Wienstein monofilament intact to all pedal sites bilateral.   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   There is no first ray hypomobility present.  Minimal discomfort with MPJ range of motion and there is mild tenderness to palpation of the bunion site.  There is mild erythema along the bony thorax been rubbing inside of her shoes in the right side there is no skin breakdown.  Hammertoes are also present.  Prominent metatarsal heads plantarly with atrophy of the fat pad no other area of tenderness identified today.  Muscular strength 5/5 in all groups tested bilateral.  Gait: Unassisted, Nonantalgic.       Assessment:   84 year old female with hyperkeratotic lesions likely due to digital deformity; HAV    Plan:  -Treatment options discussed including all alternatives, risks, and complications -Etiology of symptoms were discussed -X-rays were obtained and reviewed.  HAV is present as well as digital deformity but there is no evidence of acute fracture. -Hyperkeratotic lesions were sharply debrided x4 without any complications or bleeding after the area was cleaned with alcohol.  She tolerated well -We discussed treatment options for her overall foot mechanics and bunion.  I do think that she would benefit from an orthotic to help support her feet I also discussed the change in shoes.  She has an orthotic at home and she is going to bring that back to see Liliane Channel to see if we can modify this will make her a new one.  Liliane Channel also evaluated her today. -She has no further questions or concerns.  I will follow with her as needed.  She agrees this plan  Trula Slade DPM

## 2017-02-14 ENCOUNTER — Other Ambulatory Visit: Payer: Medicare Other | Admitting: Orthotics

## 2017-02-15 DIAGNOSIS — F321 Major depressive disorder, single episode, moderate: Secondary | ICD-10-CM | POA: Diagnosis not present

## 2017-02-15 DIAGNOSIS — I10 Essential (primary) hypertension: Secondary | ICD-10-CM | POA: Diagnosis not present

## 2017-02-23 DIAGNOSIS — R197 Diarrhea, unspecified: Secondary | ICD-10-CM | POA: Diagnosis not present

## 2017-02-23 DIAGNOSIS — Z124 Encounter for screening for malignant neoplasm of cervix: Secondary | ICD-10-CM | POA: Diagnosis not present

## 2017-02-23 DIAGNOSIS — R634 Abnormal weight loss: Secondary | ICD-10-CM | POA: Diagnosis not present

## 2017-02-27 DIAGNOSIS — R197 Diarrhea, unspecified: Secondary | ICD-10-CM | POA: Diagnosis not present

## 2017-03-07 DIAGNOSIS — N83201 Unspecified ovarian cyst, right side: Secondary | ICD-10-CM | POA: Diagnosis not present

## 2017-03-07 DIAGNOSIS — Z8507 Personal history of malignant neoplasm of pancreas: Secondary | ICD-10-CM | POA: Diagnosis not present

## 2017-03-07 DIAGNOSIS — C259 Malignant neoplasm of pancreas, unspecified: Secondary | ICD-10-CM | POA: Diagnosis not present

## 2017-03-07 DIAGNOSIS — R197 Diarrhea, unspecified: Secondary | ICD-10-CM | POA: Diagnosis not present

## 2017-03-07 DIAGNOSIS — Z08 Encounter for follow-up examination after completed treatment for malignant neoplasm: Secondary | ICD-10-CM | POA: Diagnosis not present

## 2017-03-14 DIAGNOSIS — E039 Hypothyroidism, unspecified: Secondary | ICD-10-CM | POA: Diagnosis not present

## 2017-03-14 DIAGNOSIS — M810 Age-related osteoporosis without current pathological fracture: Secondary | ICD-10-CM | POA: Diagnosis not present

## 2017-03-14 DIAGNOSIS — R197 Diarrhea, unspecified: Secondary | ICD-10-CM | POA: Diagnosis not present

## 2017-03-20 DIAGNOSIS — M4726 Other spondylosis with radiculopathy, lumbar region: Secondary | ICD-10-CM | POA: Diagnosis not present

## 2017-03-20 DIAGNOSIS — M5416 Radiculopathy, lumbar region: Secondary | ICD-10-CM | POA: Diagnosis not present

## 2017-03-20 DIAGNOSIS — M5136 Other intervertebral disc degeneration, lumbar region: Secondary | ICD-10-CM | POA: Diagnosis not present

## 2017-03-20 DIAGNOSIS — M48061 Spinal stenosis, lumbar region without neurogenic claudication: Secondary | ICD-10-CM | POA: Diagnosis not present

## 2017-03-24 ENCOUNTER — Encounter: Payer: Self-pay | Admitting: Cardiology

## 2017-03-24 ENCOUNTER — Ambulatory Visit (INDEPENDENT_AMBULATORY_CARE_PROVIDER_SITE_OTHER): Payer: Medicare Other | Admitting: Cardiology

## 2017-03-24 VITALS — BP 118/68 | HR 63 | Ht 63.0 in | Wt 129.0 lb

## 2017-03-24 DIAGNOSIS — E0590 Thyrotoxicosis, unspecified without thyrotoxic crisis or storm: Secondary | ICD-10-CM | POA: Diagnosis not present

## 2017-03-24 DIAGNOSIS — R0789 Other chest pain: Secondary | ICD-10-CM | POA: Diagnosis not present

## 2017-03-24 DIAGNOSIS — C250 Malignant neoplasm of head of pancreas: Secondary | ICD-10-CM | POA: Diagnosis not present

## 2017-03-24 NOTE — Patient Instructions (Signed)

## 2017-03-24 NOTE — Progress Notes (Signed)
Cardiology Office Note:    Date:  03/24/2017   ID:  [name], DOB 1932-11-11, MRN [id number]  PCP:  Lajean Manes, MD  Cardiologist:  No primary care provider on file.   Referring MD: Lajean Manes, MD     History of Present Illness:    [name] is a 84 y.o. female with peripheral vascular disease, pancreatic cancer post Whipple procedure last seen over 3 years ago in 2015 for atypical chest pain.  Previously felt a pressure left side breast crampy heavy weight in the morning sometimes when laying down as well.  Felt some numbness in her middle finger.  At that time we underwent a nuclear stress test on 11/28/13 which was overall low risk with no ischemia.  Reassurance.   03/24/17 - 01/2017, Depression, lack of energy. Incapacitated. Dr. Watt Climes, [place], [place]. Had hernia surgery in 2012. C.Diff at that time. Diarrhea now wipes her out. Wt loss. Off Dexilant. Increased pancreatic enz. Increased protein. Too high off dose of synthroid. Cut it in half. Sharp pains and pressure at times in night, off and on. Starting to feel better, now.    Past Medical History:  Diagnosis Date  . Aortic valve sclerosis   . C. difficile colitis   . Hyperlipidemia   . Hypothyroid   . Mild recurrent major depression ([place])   . Osteopenia   . Pancreatic cancer [facility])    Pancreatic  . Vertigo   . Vitamin D deficiency     Past Surgical History:  Procedure Laterality Date  . BLADDER SUSPENSION  1999  . CATARACT EXTRACTION  2011   bilateral  . CHOLECYSTECTOMY  1992  . lower back surgery  1994, 1995   L4-5  . multiple hernias  2007, 2012   ventral  . PARTIAL HYSTERECTOMY  1973  . SHOULDER ARTHROSCOPY  2011   rt  . TONSILLECTOMY  1942  . WHIPPLE PROCEDURE  2006    Current Medications: Current Meds  Medication Sig  . AMLODIPINE BESYLATE PO Take 2.5 mg by mouth daily.  . Ascorbic Acid (VITAMIN C) 1000 MG tablet Take 1,000 mg by mouth daily.  . cholestyramine (QUESTRAN) 4 GM/DOSE powder  Take 4 g by mouth 2 (two) times daily as needed.  . citalopram (CELEXA) 10 MG tablet Take 10 mg by mouth daily.  . cycloSPORINE (RESTASIS) 0.05 % ophthalmic emulsion Place 1 drop into both eyes 2 (two) times daily.  . diclofenac sodium (VOLTAREN) 1 % GEL Apply 2 g topically daily as needed.  Marland Kitchen levothyroxine (SYNTHROID, LEVOTHROID) 25 MCG tablet Take 25 mcg by mouth daily.   . Multiple Vitamins-Minerals (COMPLETE MULTIVITAMIN/MINERAL PO) Take 1 tablet by mouth daily.  . Probiotic Product (PROBIOTIC DAILY PO) Take by mouth.  Colbert Coyer Min [id number] (MINERALS PO) TAKE 4 TABLETS BY MOUTH DAILY  . Vitamin D, Ergocalciferol, (DRISDOL) 50000 UNITS CAPS Take 50,000 Units by mouth. Every Wednesday and Saturday     Allergies:   Ivp dye [iodinated diagnostic agents]; Amoxicillin; Ciprofloxacin; Dextromethorphan polistirex er; Diatrizoate meglumine & sodium; Flagyl [metronidazole]; Hydrocodone; Latex; Methocarbamol; Oxycodone; Prednisone; Promethazine hcl; and Tape   Social History   Socioeconomic History  . Marital status: Married    Spouse name: None  . Number of children: None  . Years of education: None  . Highest education level: None  Social Needs  . Financial resource strain: None  . Food insecurity - worry: None  . Food insecurity - inability: None  . Transportation needs - medical:  None  . Transportation needs - non-medical: None  Occupational History  . None  Tobacco Use  . Smoking status: Never Smoker  . Smokeless tobacco: Never Used  Substance and Sexual Activity  . Alcohol use: Yes  . Drug use: None  . Sexual activity: None  Other Topics Concern  . None  Social History Narrative  . None     Family History: The patient's family history includes Breast cancer in her sister; Cancer in her maternal grandfather and sister; Heart attack in her father; Heart disease in her maternal grandmother and mother; Heart failure in her mother; Hypertension in her mother, sister, and  sister.  ROS:   Please see the history of present illness.     All other systems reviewed and are negative.  EKGs/Labs/Other Studies Reviewed:    The following studies were reviewed today: Prior stress test, EKG, office notes reviewed  EKG: No new EKG.  EKG from 04/01/11 shows sinus rhythm with occasional PVCs.  Hemoglobin 13.4, hemoglobin A1c 6.5 creatinine 0.76  Recent Labs: No results found for requested labs within last 8760 hours.  Recent Lipid Panel No results found for: CHOL, TRIG, HDL, CHOLHDL, VLDL, LDLCALC, LDLDIRECT  Physical Exam:    VS:  BP 118/68   Pulse 63   Ht 5\' 3"  (1.6 m)   Wt 129 lb (58.5 kg)   SpO2 97%   BMI 22.85 kg/m     Wt Readings from Last 3 Encounters:  03/24/17 129 lb (58.5 kg)  11/27/13 142 lb (64.4 kg)  09/12/13 144 lb 1.9 oz (65.4 kg)     GEN:  Well nourished, well developed in no acute distress HEENT: Normal NECK: No JVD; No carotid bruits LYMPHATICS: No lymphadenopathy CARDIAC: RRR, no murmurs, rubs, gallops RESPIRATORY:  Clear to auscultation without rales, wheezing or rhonchi  ABDOMEN: Soft, non-tender, non-distended MUSCULOSKELETAL:  No edema; No deformity  SKIN: Warm and dry NEUROLOGIC:  Alert and oriented x 3 PSYCHIATRIC:  Normal affect   ASSESSMENT:    1. Atypical chest pain   2. Malignant neoplasm of head of pancreas ([place])   3. Hyperthyroidism    PLAN:    In order of problems listed above:  Atypical chest pain -Previous reassuring nuclear stress test.  Likely musculoskeletal or perhaps GI in etiology.  No further cardiac testing.  Reassurance has been given.  Continue with diet, exercise, protein, pancreatic enzymes.  Pancreatic cancer status post Whipple -Doing amazingly well.  Hyperthyroidism -States that she had iatrogenic hyperthyroidism.  She is cut back her Synthroid in half and is doing much better.  Classic hyperthyroid symptoms previously.  I will be happy to see her back in 1 year for  follow-up.   Medication Adjustments/Labs and Tests Ordered: Current medicines are reviewed at length with the patient today.  Concerns regarding medicines are outlined above.  No orders of the defined types were placed in this encounter.  No orders of the defined types were placed in this encounter.   Signed, Candee Furbish, MD  03/24/2017 2:28 PM    [place]

## 2017-04-03 DIAGNOSIS — R634 Abnormal weight loss: Secondary | ICD-10-CM | POA: Diagnosis not present

## 2017-04-03 DIAGNOSIS — I10 Essential (primary) hypertension: Secondary | ICD-10-CM | POA: Diagnosis not present

## 2017-04-03 DIAGNOSIS — E039 Hypothyroidism, unspecified: Secondary | ICD-10-CM | POA: Diagnosis not present

## 2017-04-13 DIAGNOSIS — R634 Abnormal weight loss: Secondary | ICD-10-CM | POA: Diagnosis not present

## 2017-04-13 DIAGNOSIS — R630 Anorexia: Secondary | ICD-10-CM | POA: Diagnosis not present

## 2017-04-13 DIAGNOSIS — Z6822 Body mass index (BMI) 22.0-22.9, adult: Secondary | ICD-10-CM | POA: Diagnosis not present

## 2017-06-07 DIAGNOSIS — M4716 Other spondylosis with myelopathy, lumbar region: Secondary | ICD-10-CM | POA: Diagnosis not present

## 2017-06-07 DIAGNOSIS — R030 Elevated blood-pressure reading, without diagnosis of hypertension: Secondary | ICD-10-CM | POA: Diagnosis not present

## 2017-07-03 DIAGNOSIS — E119 Type 2 diabetes mellitus without complications: Secondary | ICD-10-CM | POA: Diagnosis not present

## 2017-07-03 DIAGNOSIS — I10 Essential (primary) hypertension: Secondary | ICD-10-CM | POA: Diagnosis not present

## 2017-07-03 DIAGNOSIS — F320 Major depressive disorder, single episode, mild: Secondary | ICD-10-CM | POA: Diagnosis not present

## 2017-07-07 DIAGNOSIS — M5416 Radiculopathy, lumbar region: Secondary | ICD-10-CM | POA: Diagnosis not present

## 2017-07-07 DIAGNOSIS — M5116 Intervertebral disc disorders with radiculopathy, lumbar region: Secondary | ICD-10-CM | POA: Diagnosis not present

## 2017-07-31 DIAGNOSIS — E119 Type 2 diabetes mellitus without complications: Secondary | ICD-10-CM | POA: Diagnosis not present

## 2017-07-31 DIAGNOSIS — I10 Essential (primary) hypertension: Secondary | ICD-10-CM | POA: Diagnosis not present

## 2017-09-21 DIAGNOSIS — Z79899 Other long term (current) drug therapy: Secondary | ICD-10-CM | POA: Diagnosis not present

## 2017-09-21 DIAGNOSIS — I10 Essential (primary) hypertension: Secondary | ICD-10-CM | POA: Diagnosis not present

## 2017-09-21 DIAGNOSIS — F321 Major depressive disorder, single episode, moderate: Secondary | ICD-10-CM | POA: Diagnosis not present

## 2017-09-21 DIAGNOSIS — F324 Major depressive disorder, single episode, in partial remission: Secondary | ICD-10-CM | POA: Diagnosis not present

## 2017-09-21 DIAGNOSIS — E039 Hypothyroidism, unspecified: Secondary | ICD-10-CM | POA: Diagnosis not present

## 2017-09-21 DIAGNOSIS — R5383 Other fatigue: Secondary | ICD-10-CM | POA: Diagnosis not present

## 2017-10-25 ENCOUNTER — Other Ambulatory Visit: Payer: Self-pay | Admitting: Physician Assistant

## 2017-10-25 DIAGNOSIS — M810 Age-related osteoporosis without current pathological fracture: Secondary | ICD-10-CM

## 2017-10-27 ENCOUNTER — Other Ambulatory Visit: Payer: Self-pay | Admitting: Obstetrics & Gynecology

## 2017-10-27 DIAGNOSIS — Z1231 Encounter for screening mammogram for malignant neoplasm of breast: Secondary | ICD-10-CM

## 2017-11-03 DIAGNOSIS — F325 Major depressive disorder, single episode, in full remission: Secondary | ICD-10-CM | POA: Diagnosis not present

## 2017-11-03 DIAGNOSIS — H900 Conductive hearing loss, bilateral: Secondary | ICD-10-CM | POA: Diagnosis not present

## 2017-11-03 DIAGNOSIS — I10 Essential (primary) hypertension: Secondary | ICD-10-CM | POA: Diagnosis not present

## 2017-11-03 DIAGNOSIS — Z0000 Encounter for general adult medical examination without abnormal findings: Secondary | ICD-10-CM | POA: Diagnosis not present

## 2017-11-03 DIAGNOSIS — E039 Hypothyroidism, unspecified: Secondary | ICD-10-CM | POA: Diagnosis not present

## 2017-11-03 DIAGNOSIS — Z79899 Other long term (current) drug therapy: Secondary | ICD-10-CM | POA: Diagnosis not present

## 2017-11-03 DIAGNOSIS — K8689 Other specified diseases of pancreas: Secondary | ICD-10-CM | POA: Diagnosis not present

## 2017-11-03 DIAGNOSIS — D649 Anemia, unspecified: Secondary | ICD-10-CM | POA: Diagnosis not present

## 2017-11-03 DIAGNOSIS — E1165 Type 2 diabetes mellitus with hyperglycemia: Secondary | ICD-10-CM | POA: Diagnosis not present

## 2017-11-10 DIAGNOSIS — M5116 Intervertebral disc disorders with radiculopathy, lumbar region: Secondary | ICD-10-CM | POA: Diagnosis not present

## 2017-11-10 DIAGNOSIS — M5416 Radiculopathy, lumbar region: Secondary | ICD-10-CM | POA: Diagnosis not present

## 2017-11-15 DIAGNOSIS — K8689 Other specified diseases of pancreas: Secondary | ICD-10-CM | POA: Diagnosis not present

## 2017-11-15 DIAGNOSIS — D508 Other iron deficiency anemias: Secondary | ICD-10-CM | POA: Diagnosis not present

## 2017-12-04 DIAGNOSIS — D508 Other iron deficiency anemias: Secondary | ICD-10-CM | POA: Diagnosis not present

## 2017-12-12 DIAGNOSIS — Z8507 Personal history of malignant neoplasm of pancreas: Secondary | ICD-10-CM | POA: Diagnosis not present

## 2017-12-12 DIAGNOSIS — C787 Secondary malignant neoplasm of liver and intrahepatic bile duct: Secondary | ICD-10-CM | POA: Diagnosis not present

## 2017-12-12 DIAGNOSIS — E039 Hypothyroidism, unspecified: Secondary | ICD-10-CM | POA: Diagnosis not present

## 2017-12-12 DIAGNOSIS — R935 Abnormal findings on diagnostic imaging of other abdominal regions, including retroperitoneum: Secondary | ICD-10-CM | POA: Diagnosis not present

## 2017-12-12 DIAGNOSIS — M810 Age-related osteoporosis without current pathological fracture: Secondary | ICD-10-CM | POA: Diagnosis not present

## 2017-12-12 DIAGNOSIS — K8681 Exocrine pancreatic insufficiency: Secondary | ICD-10-CM | POA: Diagnosis not present

## 2017-12-12 DIAGNOSIS — Z08 Encounter for follow-up examination after completed treatment for malignant neoplasm: Secondary | ICD-10-CM | POA: Diagnosis not present

## 2017-12-12 DIAGNOSIS — C259 Malignant neoplasm of pancreas, unspecified: Secondary | ICD-10-CM | POA: Diagnosis not present

## 2017-12-12 DIAGNOSIS — R197 Diarrhea, unspecified: Secondary | ICD-10-CM | POA: Diagnosis not present

## 2017-12-12 DIAGNOSIS — C772 Secondary and unspecified malignant neoplasm of intra-abdominal lymph nodes: Secondary | ICD-10-CM | POA: Diagnosis not present

## 2017-12-12 DIAGNOSIS — Z8639 Personal history of other endocrine, nutritional and metabolic disease: Secondary | ICD-10-CM | POA: Diagnosis not present

## 2017-12-15 DIAGNOSIS — E119 Type 2 diabetes mellitus without complications: Secondary | ICD-10-CM | POA: Diagnosis not present

## 2017-12-15 DIAGNOSIS — F324 Major depressive disorder, single episode, in partial remission: Secondary | ICD-10-CM | POA: Diagnosis not present

## 2017-12-15 DIAGNOSIS — F320 Major depressive disorder, single episode, mild: Secondary | ICD-10-CM | POA: Diagnosis not present

## 2017-12-15 DIAGNOSIS — D508 Other iron deficiency anemias: Secondary | ICD-10-CM | POA: Diagnosis not present

## 2017-12-15 DIAGNOSIS — F321 Major depressive disorder, single episode, moderate: Secondary | ICD-10-CM | POA: Diagnosis not present

## 2017-12-15 DIAGNOSIS — F325 Major depressive disorder, single episode, in full remission: Secondary | ICD-10-CM | POA: Diagnosis not present

## 2017-12-15 DIAGNOSIS — I10 Essential (primary) hypertension: Secondary | ICD-10-CM | POA: Diagnosis not present

## 2017-12-15 DIAGNOSIS — E039 Hypothyroidism, unspecified: Secondary | ICD-10-CM | POA: Diagnosis not present

## 2017-12-20 ENCOUNTER — Ambulatory Visit
Admission: RE | Admit: 2017-12-20 | Discharge: 2017-12-20 | Disposition: A | Discharge status: Home / Self Care | Payer: Medicare Other | Source: Ambulatory Visit | Attending: Obstetrics & Gynecology | Admitting: Obstetrics & Gynecology

## 2017-12-20 ENCOUNTER — Ambulatory Visit
Admission: RE | Admit: 2017-12-20 | Discharge: 2017-12-20 | Disposition: A | Discharge status: Home / Self Care | Payer: Medicare Other | Source: Ambulatory Visit | Attending: Physician Assistant | Admitting: Physician Assistant

## 2017-12-20 DIAGNOSIS — Z780 Asymptomatic menopausal state: Secondary | ICD-10-CM | POA: Diagnosis not present

## 2017-12-20 DIAGNOSIS — Z1231 Encounter for screening mammogram for malignant neoplasm of breast: Secondary | ICD-10-CM | POA: Diagnosis not present

## 2017-12-20 DIAGNOSIS — M8589 Other specified disorders of bone density and structure, multiple sites: Secondary | ICD-10-CM | POA: Diagnosis not present

## 2017-12-20 DIAGNOSIS — M810 Age-related osteoporosis without current pathological fracture: Secondary | ICD-10-CM

## 2017-12-21 ENCOUNTER — Ambulatory Visit: Payer: Medicare Other

## 2017-12-21 ENCOUNTER — Other Ambulatory Visit: Payer: Medicare Other

## 2017-12-25 DIAGNOSIS — D508 Other iron deficiency anemias: Secondary | ICD-10-CM | POA: Diagnosis not present

## 2017-12-25 DIAGNOSIS — E039 Hypothyroidism, unspecified: Secondary | ICD-10-CM | POA: Diagnosis not present

## 2017-12-25 DIAGNOSIS — I10 Essential (primary) hypertension: Secondary | ICD-10-CM | POA: Diagnosis not present

## 2017-12-25 DIAGNOSIS — K8689 Other specified diseases of pancreas: Secondary | ICD-10-CM | POA: Diagnosis not present

## 2017-12-25 DIAGNOSIS — C259 Malignant neoplasm of pancreas, unspecified: Secondary | ICD-10-CM | POA: Diagnosis not present

## 2018-02-27 DIAGNOSIS — D508 Other iron deficiency anemias: Secondary | ICD-10-CM | POA: Diagnosis not present

## 2018-02-27 DIAGNOSIS — I10 Essential (primary) hypertension: Secondary | ICD-10-CM | POA: Diagnosis not present

## 2018-02-27 DIAGNOSIS — E1169 Type 2 diabetes mellitus with other specified complication: Secondary | ICD-10-CM | POA: Diagnosis not present

## 2018-02-27 DIAGNOSIS — F320 Major depressive disorder, single episode, mild: Secondary | ICD-10-CM | POA: Diagnosis not present

## 2018-02-27 DIAGNOSIS — F325 Major depressive disorder, single episode, in full remission: Secondary | ICD-10-CM | POA: Diagnosis not present

## 2018-02-27 DIAGNOSIS — E119 Type 2 diabetes mellitus without complications: Secondary | ICD-10-CM | POA: Diagnosis not present

## 2018-02-27 DIAGNOSIS — F324 Major depressive disorder, single episode, in partial remission: Secondary | ICD-10-CM | POA: Diagnosis not present

## 2018-02-27 DIAGNOSIS — F321 Major depressive disorder, single episode, moderate: Secondary | ICD-10-CM | POA: Diagnosis not present

## 2018-02-27 DIAGNOSIS — E039 Hypothyroidism, unspecified: Secondary | ICD-10-CM | POA: Diagnosis not present

## 2018-03-06 DIAGNOSIS — K8689 Other specified diseases of pancreas: Secondary | ICD-10-CM | POA: Diagnosis not present

## 2018-03-06 DIAGNOSIS — Z79899 Other long term (current) drug therapy: Secondary | ICD-10-CM | POA: Diagnosis not present

## 2018-03-06 DIAGNOSIS — R1013 Epigastric pain: Secondary | ICD-10-CM | POA: Diagnosis not present

## 2018-03-06 DIAGNOSIS — E1169 Type 2 diabetes mellitus with other specified complication: Secondary | ICD-10-CM | POA: Diagnosis not present

## 2018-03-06 DIAGNOSIS — R5383 Other fatigue: Secondary | ICD-10-CM | POA: Diagnosis not present

## 2018-03-06 DIAGNOSIS — E039 Hypothyroidism, unspecified: Secondary | ICD-10-CM | POA: Diagnosis not present

## 2018-03-06 DIAGNOSIS — K210 Gastro-esophageal reflux disease with esophagitis: Secondary | ICD-10-CM | POA: Diagnosis not present

## 2018-03-06 DIAGNOSIS — I10 Essential (primary) hypertension: Secondary | ICD-10-CM | POA: Diagnosis not present

## 2018-03-15 ENCOUNTER — Encounter: Payer: Self-pay | Admitting: Cardiology

## 2018-03-26 DIAGNOSIS — C259 Malignant neoplasm of pancreas, unspecified: Secondary | ICD-10-CM | POA: Diagnosis not present

## 2018-03-26 DIAGNOSIS — A09 Infectious gastroenteritis and colitis, unspecified: Secondary | ICD-10-CM | POA: Diagnosis not present

## 2018-03-26 DIAGNOSIS — K210 Gastro-esophageal reflux disease with esophagitis: Secondary | ICD-10-CM | POA: Diagnosis not present

## 2018-04-02 ENCOUNTER — Ambulatory Visit: Payer: Medicare Other | Admitting: Cardiology

## 2018-05-24 DIAGNOSIS — I10 Essential (primary) hypertension: Secondary | ICD-10-CM | POA: Diagnosis not present

## 2018-05-24 DIAGNOSIS — F324 Major depressive disorder, single episode, in partial remission: Secondary | ICD-10-CM | POA: Diagnosis not present

## 2018-05-24 DIAGNOSIS — M8580 Other specified disorders of bone density and structure, unspecified site: Secondary | ICD-10-CM | POA: Diagnosis not present

## 2018-05-24 DIAGNOSIS — F320 Major depressive disorder, single episode, mild: Secondary | ICD-10-CM | POA: Diagnosis not present

## 2018-05-24 DIAGNOSIS — F321 Major depressive disorder, single episode, moderate: Secondary | ICD-10-CM | POA: Diagnosis not present

## 2018-05-24 DIAGNOSIS — D508 Other iron deficiency anemias: Secondary | ICD-10-CM | POA: Diagnosis not present

## 2018-05-24 DIAGNOSIS — E119 Type 2 diabetes mellitus without complications: Secondary | ICD-10-CM | POA: Diagnosis not present

## 2018-05-24 DIAGNOSIS — E1169 Type 2 diabetes mellitus with other specified complication: Secondary | ICD-10-CM | POA: Diagnosis not present

## 2018-05-24 DIAGNOSIS — E039 Hypothyroidism, unspecified: Secondary | ICD-10-CM | POA: Diagnosis not present

## 2018-05-24 DIAGNOSIS — F325 Major depressive disorder, single episode, in full remission: Secondary | ICD-10-CM | POA: Diagnosis not present

## 2018-05-25 ENCOUNTER — Ambulatory Visit: Payer: Medicare Other | Admitting: Cardiology

## 2018-06-13 DIAGNOSIS — C250 Malignant neoplasm of head of pancreas: Secondary | ICD-10-CM | POA: Diagnosis not present

## 2018-06-21 ENCOUNTER — Telehealth: Payer: Self-pay

## 2018-06-21 NOTE — Telephone Encounter (Signed)
I called pt to go over her appt with you on Tuesday. She stated that she is having some swelling in both ankles, feet. This has happened before and she took Lasix for 4 days and it helped. She would like [place] to send in a prescription for this.Marland Kitchenis this ok?  She has a Virtual appt with you on Tuesday 5/26.

## 2018-06-21 NOTE — Telephone Encounter (Signed)
YOUR CARDIOLOGY TEAM HAS ARRANGED FOR AN E-VISIT FOR YOUR APPOINTMENT - PLEASE REVIEW IMPORTANT INFORMATION BELOW SEVERAL DAYS PRIOR TO YOUR APPOINTMENT  Due to the recent COVID-19 pandemic, we are transitioning in-person office visits to tele-medicine visits in an effort to decrease unnecessary exposure to our patients, their families, and staff. These visits are billed to your insurance just like a normal visit is. We also encourage you to sign up for MyChart if you have not already done so. You will need a smartphone if possible. For patients that do not have this, we can still complete the visit using a regular telephone but do prefer a smartphone to enable video when possible. You may have a family member that lives with you that can help. If possible, we also ask that you have a blood pressure cuff and scale at home to measure your blood pressure, heart rate and weight prior to your scheduled appointment. Patients with clinical needs that need an in-person evaluation and testing will still be able to come to the office if absolutely necessary. If you have any questions, feel free to call our office.     YOUR PROVIDER WILL BE USING THE FOLLOWING PLATFORM TO COMPLETE YOUR VISIT: Doxy.Me  . IF USING MYCHART - How to Download the MyChart App to Your SmartPhone   - If Apple, go to App Store and type in MyChart in the search bar and download the app. If Android, ask patient to go to Google Play Store and type in MyChart in the search bar and download the app. The app is free but as with any other app downloads, your phone may require you to verify saved payment information or Apple/Android password.  - You will need to then log into the app with your MyChart username and password, and select [facility] as your healthcare provider to link the account.  - When it is time for your visit, go to the MyChart app, find appointments, and click Begin Video Visit. Be sure to Select Allow for your device to  access the Microphone and Camera for your visit. You will then be connected, and your provider will be with you shortly.  **If you have any issues connecting or need assistance, please contact MyChart service desk [phone] ([phone])**  **If using a computer, in order to ensure the best quality for your visit, you will need to use either of the following Internet Browsers: Google Chrome or Microsoft Edge**  . IF USING DOXIMITY or DOXY.ME - The staff will give you instructions on receiving your link to join the meeting the day of your visit.      2-3 DAYS BEFORE YOUR APPOINTMENT  You will receive a telephone call from one of our [facility] team members - your caller ID may say "Unknown caller." If this is a video visit, we will walk you through how to get the video launched on your phone. We will remind you check your blood pressure, heart rate and weight prior to your scheduled appointment. If you have an Apple Watch or Kardia, please upload any pertinent ECG strips the day before or morning of your appointment to MyChart. Our staff will also make sure you have reviewed the consent and agree to move forward with your scheduled tele-health visit.     THE DAY OF YOUR APPOINTMENT  Approximately 15 minutes prior to your scheduled appointment, you will receive a telephone call from one of HeartCare team - your caller ID may say "Unknown caller."    Our staff will confirm medications, vital signs for the day and any symptoms you may be experiencing. Please have this information available prior to the time of visit start. It may also be helpful for you to have a pad of paper and pen handy for any instructions given during your visit. They will also walk you through joining the smartphone meeting if this is a video visit.    CONSENT FOR TELE-HEALTH VISIT - PLEASE REVIEW  I hereby voluntarily request, consent and authorize [facility] and its employed or contracted physicians, physician  assistants, nurse practitioners or other licensed health care professionals (the Practitioner), to provide me with telemedicine health care services (the "Services") as deemed necessary by the treating Practitioner. I acknowledge and consent to receive the Services by the Practitioner via telemedicine. I understand that the telemedicine visit will involve communicating with the Practitioner through live audiovisual communication technology and the disclosure of certain medical information by electronic transmission. I acknowledge that I have been given the opportunity to request an in-person assessment or other available alternative prior to the telemedicine visit and am voluntarily participating in the telemedicine visit.  I understand that I have the right to withhold or withdraw my consent to the use of telemedicine in the course of my care at any time, without affecting my right to future care or treatment, and that the Practitioner or I may terminate the telemedicine visit at any time. I understand that I have the right to inspect all information obtained and/or recorded in the course of the telemedicine visit and may receive copies of available information for a reasonable fee.  I understand that some of the potential risks of receiving the Services via telemedicine include:  . Delay or interruption in medical evaluation due to technological equipment failure or disruption; . Information transmitted may not be sufficient (e.g. poor resolution of images) to allow for appropriate medical decision making by the Practitioner; and/or  . In rare instances, security protocols could fail, causing a breach of personal health information.  Furthermore, I acknowledge that it is my responsibility to provide information about my medical history, conditions and care that is complete and accurate to the best of my ability. I acknowledge that Practitioner's advice, recommendations, and/or decision may be based on  factors not within their control, such as incomplete or inaccurate data provided by me or distortions of diagnostic images or specimens that may result from electronic transmissions. I understand that the practice of medicine is not an exact science and that Practitioner makes no warranties or guarantees regarding treatment outcomes. I acknowledge that I will receive a copy of this consent concurrently upon execution via email to the email address I last provided but may also request a printed copy by calling the office of [facility].    I understand that my insurance will be billed for this visit.   I have read or had this consent read to me. . I understand the contents of this consent, which adequately explains the benefits and risks of the Services being provided via telemedicine.  . I have been provided ample opportunity to ask questions regarding this consent and the Services and have had my questions answered to my satisfaction. . I give my informed consent for the services to be provided through the use of telemedicine in my medical care  By participating in this telemedicine visit I agree to the above.  

## 2018-06-22 ENCOUNTER — Other Ambulatory Visit: Payer: Self-pay

## 2018-06-22 NOTE — Telephone Encounter (Signed)
Spoke with the pt and she reports that Dr. Felipa Eth has sent on her RX for Lasix 20 mg.. she has had increased edema on the tops of her feet.Marland Kitchen and wakes up in the morning with puffy hands. She sounded very SOB on the on phone and when I asked her about it.. she got very upset that she is rying to take care of her husband... she has to feed him, bathe him, and he is up most of the night with his dementia... she was crying and said he is "ugly'" her.. she was very upset and not sure what to do.. I asked who her husbands PCP is and it is also Dr. Felipa Eth... they had [facility] coming but they have ot returned due to [place] [facility] precautions.. she says it is too much for her.  Their daughter lives in [place] and she visits periodically and brings meals for their freezer.   I have advised her to elevate her feet when sitting but she says she cannot sit often taking care of her husband.   I spoke with the pts daughter with her permission and she reports that she has tried to get her help but the pt refises it and will not consider assisted living and there is a family issue where the brother is more local but not helping as he should... She was appreciative for the call and will reach out to the pt and her PCP about other options.

## 2018-06-26 ENCOUNTER — Telehealth (INDEPENDENT_AMBULATORY_CARE_PROVIDER_SITE_OTHER): Payer: Medicare Other | Admitting: Cardiology

## 2018-06-26 ENCOUNTER — Other Ambulatory Visit: Payer: Self-pay

## 2018-06-26 ENCOUNTER — Encounter: Payer: Self-pay | Admitting: Cardiology

## 2018-06-26 VITALS — BP 129/65 | HR 72 | Temp 97.3°F | Ht 63.0 in | Wt 119.0 lb

## 2018-06-26 DIAGNOSIS — R0789 Other chest pain: Secondary | ICD-10-CM

## 2018-06-26 DIAGNOSIS — C250 Malignant neoplasm of head of pancreas: Secondary | ICD-10-CM

## 2018-06-26 DIAGNOSIS — E0590 Thyrotoxicosis, unspecified without thyrotoxic crisis or storm: Secondary | ICD-10-CM

## 2018-06-26 NOTE — Patient Instructions (Signed)
Medication Instructions:  The current medical regimen is effective;  continue present plan and medications.  If you need a refill on your cardiac medications before your next appointment, please call your pharmacy.   Follow-Up: At [facility], you and your health needs are our priority.  As part of our continuing mission to provide you with exceptional heart care, we have created designated Provider Care Teams.  These Care Teams include your primary Cardiologist (physician) and Advanced Practice Providers (APPs -  Physician Assistants and Nurse Practitioners) who all work together to provide you with the care you need, when you need it. You will need a follow up appointment in 12 months.  Please call our office 2 months in advance to schedule this appointment.  You may see Mark Skains, MD or one of the following Advanced Practice Providers on your designated Care Team:   Lori Gerhardt, NP Laura Ingold, NP . Jill McDaniel, NP  Thank you for choosing [facility]!!      

## 2018-06-26 NOTE — Telephone Encounter (Signed)
Virtual appt today at 1:20 pm with Dr Marlou Porch.  This will be addressed at that time.

## 2018-06-26 NOTE — Progress Notes (Signed)
Virtual Visit via Video Note   This visit type was conducted due to national recommendations for restrictions regarding the COVID-19 Pandemic (e.g. social distancing) in an effort to limit this patient's exposure and mitigate transmission in our community.  Due to her co-morbid illnesses, this patient is at least at moderate risk for complications without adequate follow up.  This format is felt to be most appropriate for this patient at this time.  All issues noted in this document were discussed and addressed.  A limited physical exam was performed with this format.  Please refer to the patient's chart for her consent to telehealth for [facility].   Date:  06/26/2018   ID:  [name], DOB [birth year], MRN [id number]  Patient Location: Home Provider Location: Home  PCP:  Lajean Manes, MD  Cardiologist:  Candee Furbish, MD  Electrophysiologist:  None   Evaluation Performed:  Follow-Up Visit  Chief Complaint:  Low in energy.   History of Present Illness:    [name] is a 85 y.o. female with peripheral vascular disease, pancreatic cancer post Whipple procedure 2006, came back 2008 went to [facility] and joined trial (tumor was in the liver but disappeared),  here for follow-up.  Has been quite depressed, lack of energy.  A nuclear stress test was previously done and was reassuring.  It was felt that some of her chest discomfort was possibly GI in etiology or musculoskeletal with her increased rest.  Prelieve assurance has been given.  Her caregiver role for her husband with Alzheimer's dementia has been very challenging for her.  She broke out into tears previously on the phone with 1 of our staff members.  April called her daughter at her permission who reports that she has tried to get her increased help but that she has refused it and will not consider assisted living and there is a family issue   Challenging dynamics. Trying to get the house ready to sell.    The  patient does not have symptoms concerning for COVID-19 infection (fever, chills, cough, or new shortness of breath).    Past Medical History:  Diagnosis Date  . Aortic valve sclerosis   . C. difficile colitis   . Hyperlipidemia   . Hypothyroid   . Mild recurrent major depression ([place])   . Osteopenia   . Pancreatic cancer [facility])    Pancreatic  . Vertigo   . Vitamin D deficiency    Past Surgical History:  Procedure Laterality Date  . BLADDER SUSPENSION  1999  . CATARACT EXTRACTION  2011   bilateral  . CHOLECYSTECTOMY  1992  . lower back surgery  1994, 1995   L4-5  . multiple hernias  2007, 2012   ventral  . PARTIAL HYSTERECTOMY  1973  . SHOULDER ARTHROSCOPY  2011   rt  . TONSILLECTOMY  1942  . WHIPPLE PROCEDURE  2006     Current Meds  Medication Sig  . cholestyramine (QUESTRAN) 4 GM/DOSE powder Take 4 g by mouth 2 (two) times daily as needed.  . citalopram (CELEXA) 10 MG tablet Take 10 mg by mouth daily.  . cycloSPORINE (RESTASIS) 0.05 % ophthalmic emulsion Place 1 drop into both eyes 2 (two) times daily.  . diclofenac sodium (VOLTAREN) 1 % GEL Apply 2 g topically daily as needed.  . furosemide (LASIX) 20 MG tablet Take 20 mg by mouth daily.  Marland Kitchen lisinopril (ZESTRIL) 10 MG tablet Take 10 mg by mouth daily.  . Multiple Vitamins-Minerals (  COMPLETE MULTIVITAMIN/MINERAL PO) Take 1 tablet by mouth daily.  . Probiotic Product (PROBIOTIC DAILY PO) Take by mouth.  Colbert Coyer Min CaCrCuFeKMgMnPSeZn (MINERALS PO) TAKE 4 TABLETS BY MOUTH DAILY  . Vitamin D, Ergocalciferol, (DRISDOL) 50000 UNITS CAPS Take 50,000 Units by mouth. Every Wednesday and Saturday     Allergies:   Ivp dye [iodinated diagnostic agents]; Amoxicillin; Ciprofloxacin; Dextromethorphan polistirex er; Diatrizoate meglumine & sodium; Flagyl [metronidazole]; Hydrocodone; Latex; Methocarbamol; Oxycodone; Prednisone; Promethazine hcl; and Tape   Social History   Tobacco Use  . Smoking status: Never Smoker  . Smokeless  tobacco: Never Used  Substance Use Topics  . Alcohol use: Yes  . Drug use: Never     Family Hx: The patient's family history includes Breast cancer in her sister; Cancer in her maternal grandfather and sister; Heart attack in her father; Heart disease in her maternal grandmother and mother; Heart failure in her mother; Hypertension in her mother, sister, and sister.  ROS:   Please see the history of present illness.    Denies any fever chills nausea vomiting syncope bleeding All other systems reviewed and are negative.   Prior CV studies:   The following studies were reviewed today:  Prior stress test 2015 low risk  Labs/Other Tests and Data Reviewed:    EKG:  An ECG dated 09/12/2013 was personally reviewed today and demonstrated:  Sinus rhythm with nonspecific T wave changes  Recent Labs: No results found for requested labs within last 8760 hours.   Recent Lipid Panel No results found for: CHOL, TRIG, HDL, CHOLHDL, LDLCALC, LDLDIRECT  Wt Readings from Last 3 Encounters:  06/26/18 119 lb (54 kg)  03/24/17 129 lb (58.5 kg)  11/27/13 142 lb (64.4 kg)     Objective:    Vital Signs:  BP 129/65   Pulse 72   Temp (!) 97.3 F (36.3 C)   Ht 5\' 3"  (1.6 m)   Wt 119 lb (54 kg)   BMI 21.08 kg/m    VITAL SIGNS:  reviewed GEN:  no acute distress EYES:  sclerae anicteric, EOMI - Extraocular Movements Intact RESPIRATORY:  normal respiratory effort, symmetric expansion SKIN:  no rash, lesions or ulcers. MUSCULOSKELETAL:  no obvious deformities. NEURO:  alert and oriented x 3, no obvious focal deficit PSYCH:  normal affect  ASSESSMENT & PLAN:    Lower extremity edema - Dr. Felipa Eth center and Lasix 20 mg because of increasing edema on the tops of her feet, puffy hands.  She is struggling quite a bit emotionally taking care of her husband with dementia.  [facility] has been enlisted but with decreased availability because of COVID-19.  This is definitely improved.   She agrees with the fact that she probably has lack of protein.  Prior atypical chest pain - Nuclear stress test previously reassuring.  Could be possibly related to GI or musculoskeletal/stress issues.  Hypothyroidism - Previously had hyperthyroidism from Synthroid.  This is been adjusted.  Depression/anxiety-certainly playing a large role in her overall health.  Struggling as caregiver for her husband.   Puffiness in her feet may be from decreased overall protein.  COVID-19 Education: The signs and symptoms of COVID-19 were discussed with the patient and how to seek care for testing (follow up with PCP or arrange E-visit).  The importance of social distancing was discussed today.  Time:   Today, I have spent 11 minutes with the patient with telehealth technology discussing the above problems.     Medication Adjustments/Labs and  Tests Ordered: Current medicines are reviewed at length with the patient today.  Concerns regarding medicines are outlined above.   Tests Ordered: No orders of the defined types were placed in this encounter.   Medication Changes: No orders of the defined types were placed in this encounter.   Disposition:  Follow up in 1 year(s)  Signed, Candee Furbish, MD  06/26/2018 2:26 PM    [facility]

## 2018-06-27 DIAGNOSIS — E039 Hypothyroidism, unspecified: Secondary | ICD-10-CM | POA: Diagnosis not present

## 2018-06-27 DIAGNOSIS — F324 Major depressive disorder, single episode, in partial remission: Secondary | ICD-10-CM | POA: Diagnosis not present

## 2018-06-27 DIAGNOSIS — M8580 Other specified disorders of bone density and structure, unspecified site: Secondary | ICD-10-CM | POA: Diagnosis not present

## 2018-06-27 DIAGNOSIS — E1169 Type 2 diabetes mellitus with other specified complication: Secondary | ICD-10-CM | POA: Diagnosis not present

## 2018-06-27 DIAGNOSIS — F325 Major depressive disorder, single episode, in full remission: Secondary | ICD-10-CM | POA: Diagnosis not present

## 2018-06-27 DIAGNOSIS — F439 Reaction to severe stress, unspecified: Secondary | ICD-10-CM | POA: Diagnosis not present

## 2018-06-27 DIAGNOSIS — D508 Other iron deficiency anemias: Secondary | ICD-10-CM | POA: Diagnosis not present

## 2018-06-27 DIAGNOSIS — F320 Major depressive disorder, single episode, mild: Secondary | ICD-10-CM | POA: Diagnosis not present

## 2018-06-27 DIAGNOSIS — F321 Major depressive disorder, single episode, moderate: Secondary | ICD-10-CM | POA: Diagnosis not present

## 2018-06-27 DIAGNOSIS — I10 Essential (primary) hypertension: Secondary | ICD-10-CM | POA: Diagnosis not present

## 2018-06-27 DIAGNOSIS — R634 Abnormal weight loss: Secondary | ICD-10-CM | POA: Diagnosis not present

## 2018-06-27 DIAGNOSIS — E119 Type 2 diabetes mellitus without complications: Secondary | ICD-10-CM | POA: Diagnosis not present

## 2018-06-27 DIAGNOSIS — K8689 Other specified diseases of pancreas: Secondary | ICD-10-CM | POA: Diagnosis not present

## 2018-07-04 DIAGNOSIS — Z961 Presence of intraocular lens: Secondary | ICD-10-CM | POA: Diagnosis not present

## 2018-07-04 DIAGNOSIS — H52203 Unspecified astigmatism, bilateral: Secondary | ICD-10-CM | POA: Diagnosis not present

## 2018-07-19 DIAGNOSIS — F324 Major depressive disorder, single episode, in partial remission: Secondary | ICD-10-CM | POA: Diagnosis not present

## 2018-07-19 DIAGNOSIS — E039 Hypothyroidism, unspecified: Secondary | ICD-10-CM | POA: Diagnosis not present

## 2018-07-19 DIAGNOSIS — F325 Major depressive disorder, single episode, in full remission: Secondary | ICD-10-CM | POA: Diagnosis not present

## 2018-07-19 DIAGNOSIS — E1169 Type 2 diabetes mellitus with other specified complication: Secondary | ICD-10-CM | POA: Diagnosis not present

## 2018-07-19 DIAGNOSIS — F320 Major depressive disorder, single episode, mild: Secondary | ICD-10-CM | POA: Diagnosis not present

## 2018-07-19 DIAGNOSIS — I10 Essential (primary) hypertension: Secondary | ICD-10-CM | POA: Diagnosis not present

## 2018-07-19 DIAGNOSIS — M8580 Other specified disorders of bone density and structure, unspecified site: Secondary | ICD-10-CM | POA: Diagnosis not present

## 2018-07-19 DIAGNOSIS — E119 Type 2 diabetes mellitus without complications: Secondary | ICD-10-CM | POA: Diagnosis not present

## 2018-07-19 DIAGNOSIS — D508 Other iron deficiency anemias: Secondary | ICD-10-CM | POA: Diagnosis not present

## 2018-07-19 DIAGNOSIS — F321 Major depressive disorder, single episode, moderate: Secondary | ICD-10-CM | POA: Diagnosis not present

## 2018-07-25 DIAGNOSIS — L821 Other seborrheic keratosis: Secondary | ICD-10-CM | POA: Diagnosis not present

## 2018-07-25 DIAGNOSIS — R030 Elevated blood-pressure reading, without diagnosis of hypertension: Secondary | ICD-10-CM | POA: Diagnosis not present

## 2018-07-25 DIAGNOSIS — M546 Pain in thoracic spine: Secondary | ICD-10-CM | POA: Diagnosis not present

## 2018-07-25 DIAGNOSIS — L820 Inflamed seborrheic keratosis: Secondary | ICD-10-CM | POA: Diagnosis not present

## 2018-07-25 DIAGNOSIS — M542 Cervicalgia: Secondary | ICD-10-CM | POA: Diagnosis not present

## 2018-07-27 DIAGNOSIS — K8689 Other specified diseases of pancreas: Secondary | ICD-10-CM | POA: Diagnosis not present

## 2018-07-27 DIAGNOSIS — R630 Anorexia: Secondary | ICD-10-CM | POA: Diagnosis not present

## 2018-07-27 DIAGNOSIS — R634 Abnormal weight loss: Secondary | ICD-10-CM | POA: Diagnosis not present

## 2018-08-05 DIAGNOSIS — M47812 Spondylosis without myelopathy or radiculopathy, cervical region: Secondary | ICD-10-CM | POA: Diagnosis not present

## 2018-08-07 DIAGNOSIS — D508 Other iron deficiency anemias: Secondary | ICD-10-CM | POA: Diagnosis not present

## 2018-08-07 DIAGNOSIS — M545 Low back pain: Secondary | ICD-10-CM | POA: Diagnosis not present

## 2018-08-07 DIAGNOSIS — R42 Dizziness and giddiness: Secondary | ICD-10-CM | POA: Diagnosis not present

## 2018-08-07 DIAGNOSIS — G8929 Other chronic pain: Secondary | ICD-10-CM | POA: Diagnosis not present

## 2018-08-07 DIAGNOSIS — R1084 Generalized abdominal pain: Secondary | ICD-10-CM | POA: Diagnosis not present

## 2018-08-13 DIAGNOSIS — M5415 Radiculopathy, thoracolumbar region: Secondary | ICD-10-CM | POA: Diagnosis not present

## 2018-08-13 DIAGNOSIS — M5116 Intervertebral disc disorders with radiculopathy, lumbar region: Secondary | ICD-10-CM | POA: Diagnosis not present

## 2018-08-14 DIAGNOSIS — Z124 Encounter for screening for malignant neoplasm of cervix: Secondary | ICD-10-CM | POA: Diagnosis not present

## 2018-08-14 DIAGNOSIS — N83201 Unspecified ovarian cyst, right side: Secondary | ICD-10-CM | POA: Diagnosis not present

## 2018-08-14 DIAGNOSIS — N8300 Follicular cyst of ovary, unspecified side: Secondary | ICD-10-CM | POA: Diagnosis not present

## 2018-08-23 ENCOUNTER — Other Ambulatory Visit: Payer: Self-pay | Admitting: Gastroenterology

## 2018-08-23 DIAGNOSIS — R1084 Generalized abdominal pain: Secondary | ICD-10-CM

## 2018-08-31 ENCOUNTER — Other Ambulatory Visit (HOSPITAL_COMMUNITY): Payer: Self-pay | Admitting: Gastroenterology

## 2018-08-31 ENCOUNTER — Other Ambulatory Visit: Payer: Self-pay | Admitting: Gastroenterology

## 2018-08-31 DIAGNOSIS — F321 Major depressive disorder, single episode, moderate: Secondary | ICD-10-CM | POA: Diagnosis not present

## 2018-08-31 DIAGNOSIS — F324 Major depressive disorder, single episode, in partial remission: Secondary | ICD-10-CM | POA: Diagnosis not present

## 2018-08-31 DIAGNOSIS — E1169 Type 2 diabetes mellitus with other specified complication: Secondary | ICD-10-CM | POA: Diagnosis not present

## 2018-08-31 DIAGNOSIS — E119 Type 2 diabetes mellitus without complications: Secondary | ICD-10-CM | POA: Diagnosis not present

## 2018-08-31 DIAGNOSIS — D508 Other iron deficiency anemias: Secondary | ICD-10-CM | POA: Diagnosis not present

## 2018-08-31 DIAGNOSIS — F325 Major depressive disorder, single episode, in full remission: Secondary | ICD-10-CM | POA: Diagnosis not present

## 2018-08-31 DIAGNOSIS — F320 Major depressive disorder, single episode, mild: Secondary | ICD-10-CM | POA: Diagnosis not present

## 2018-08-31 DIAGNOSIS — I10 Essential (primary) hypertension: Secondary | ICD-10-CM | POA: Diagnosis not present

## 2018-08-31 DIAGNOSIS — E039 Hypothyroidism, unspecified: Secondary | ICD-10-CM | POA: Diagnosis not present

## 2018-08-31 DIAGNOSIS — R1084 Generalized abdominal pain: Secondary | ICD-10-CM

## 2018-08-31 DIAGNOSIS — M8580 Other specified disorders of bone density and structure, unspecified site: Secondary | ICD-10-CM | POA: Diagnosis not present

## 2018-09-04 DIAGNOSIS — M47812 Spondylosis without myelopathy or radiculopathy, cervical region: Secondary | ICD-10-CM | POA: Diagnosis not present

## 2018-09-05 ENCOUNTER — Other Ambulatory Visit: Payer: Self-pay

## 2018-09-05 ENCOUNTER — Emergency Department (HOSPITAL_COMMUNITY): Payer: Medicare Other

## 2018-09-05 ENCOUNTER — Inpatient Hospital Stay (HOSPITAL_COMMUNITY)
Admission: EM | Admit: 2018-09-05 | Discharge: 2018-09-07 | DRG: 384 | Disposition: A | Discharge status: Home Health | Payer: Medicare Other | Attending: Family Medicine | Admitting: Family Medicine

## 2018-09-05 ENCOUNTER — Encounter (HOSPITAL_COMMUNITY): Payer: Self-pay | Admitting: Obstetrics and Gynecology

## 2018-09-05 DIAGNOSIS — R55 Syncope and collapse: Secondary | ICD-10-CM

## 2018-09-05 DIAGNOSIS — I10 Essential (primary) hypertension: Secondary | ICD-10-CM | POA: Diagnosis not present

## 2018-09-05 DIAGNOSIS — I959 Hypotension, unspecified: Secondary | ICD-10-CM | POA: Diagnosis present

## 2018-09-05 DIAGNOSIS — Z79899 Other long term (current) drug therapy: Secondary | ICD-10-CM

## 2018-09-05 DIAGNOSIS — K259 Gastric ulcer, unspecified as acute or chronic, without hemorrhage or perforation: Principal | ICD-10-CM

## 2018-09-05 DIAGNOSIS — Z881 Allergy status to other antibiotic agents status: Secondary | ICD-10-CM

## 2018-09-05 DIAGNOSIS — R109 Unspecified abdominal pain: Secondary | ICD-10-CM | POA: Diagnosis not present

## 2018-09-05 DIAGNOSIS — Z8507 Personal history of malignant neoplasm of pancreas: Secondary | ICD-10-CM

## 2018-09-05 DIAGNOSIS — R1013 Epigastric pain: Secondary | ICD-10-CM | POA: Diagnosis not present

## 2018-09-05 DIAGNOSIS — F330 Major depressive disorder, recurrent, mild: Secondary | ICD-10-CM | POA: Diagnosis present

## 2018-09-05 DIAGNOSIS — R935 Abnormal findings on diagnostic imaging of other abdominal regions, including retroperitoneum: Secondary | ICD-10-CM | POA: Diagnosis present

## 2018-09-05 DIAGNOSIS — Z8249 Family history of ischemic heart disease and other diseases of the circulatory system: Secondary | ICD-10-CM

## 2018-09-05 DIAGNOSIS — E559 Vitamin D deficiency, unspecified: Secondary | ICD-10-CM | POA: Diagnosis present

## 2018-09-05 DIAGNOSIS — I358 Other nonrheumatic aortic valve disorders: Secondary | ICD-10-CM | POA: Diagnosis present

## 2018-09-05 DIAGNOSIS — E860 Dehydration: Secondary | ICD-10-CM | POA: Diagnosis not present

## 2018-09-05 DIAGNOSIS — D649 Anemia, unspecified: Secondary | ICD-10-CM

## 2018-09-05 DIAGNOSIS — Z91040 Latex allergy status: Secondary | ICD-10-CM

## 2018-09-05 DIAGNOSIS — K920 Hematemesis: Secondary | ICD-10-CM | POA: Diagnosis present

## 2018-09-05 DIAGNOSIS — Z91041 Radiographic dye allergy status: Secondary | ICD-10-CM

## 2018-09-05 DIAGNOSIS — M8580 Other specified disorders of bone density and structure, unspecified site: Secondary | ICD-10-CM | POA: Diagnosis present

## 2018-09-05 DIAGNOSIS — E876 Hypokalemia: Secondary | ICD-10-CM | POA: Diagnosis present

## 2018-09-05 DIAGNOSIS — Z20828 Contact with and (suspected) exposure to other viral communicable diseases: Secondary | ICD-10-CM | POA: Diagnosis present

## 2018-09-05 DIAGNOSIS — K289 Gastrojejunal ulcer, unspecified as acute or chronic, without hemorrhage or perforation: Secondary | ICD-10-CM | POA: Diagnosis not present

## 2018-09-05 DIAGNOSIS — R1084 Generalized abdominal pain: Secondary | ICD-10-CM | POA: Diagnosis present

## 2018-09-05 DIAGNOSIS — Z885 Allergy status to narcotic agent status: Secondary | ICD-10-CM

## 2018-09-05 DIAGNOSIS — R8271 Bacteriuria: Secondary | ICD-10-CM | POA: Diagnosis present

## 2018-09-05 DIAGNOSIS — Z888 Allergy status to other drugs, medicaments and biological substances status: Secondary | ICD-10-CM

## 2018-09-05 DIAGNOSIS — D500 Iron deficiency anemia secondary to blood loss (chronic): Secondary | ICD-10-CM | POA: Diagnosis present

## 2018-09-05 DIAGNOSIS — R8281 Pyuria: Secondary | ICD-10-CM | POA: Diagnosis not present

## 2018-09-05 DIAGNOSIS — E785 Hyperlipidemia, unspecified: Secondary | ICD-10-CM | POA: Diagnosis present

## 2018-09-05 DIAGNOSIS — Z91048 Other nonmedicinal substance allergy status: Secondary | ICD-10-CM

## 2018-09-05 DIAGNOSIS — R531 Weakness: Secondary | ICD-10-CM | POA: Diagnosis not present

## 2018-09-05 DIAGNOSIS — E039 Hypothyroidism, unspecified: Secondary | ICD-10-CM | POA: Diagnosis present

## 2018-09-05 DIAGNOSIS — Z90411 Acquired partial absence of pancreas: Secondary | ICD-10-CM

## 2018-09-05 DIAGNOSIS — K5730 Diverticulosis of large intestine without perforation or abscess without bleeding: Secondary | ICD-10-CM | POA: Diagnosis not present

## 2018-09-05 DIAGNOSIS — Z79890 Hormone replacement therapy (postmenopausal): Secondary | ICD-10-CM

## 2018-09-05 DIAGNOSIS — Z66 Do not resuscitate: Secondary | ICD-10-CM | POA: Diagnosis present

## 2018-09-05 DIAGNOSIS — I739 Peripheral vascular disease, unspecified: Secondary | ICD-10-CM | POA: Diagnosis present

## 2018-09-05 DIAGNOSIS — K8681 Exocrine pancreatic insufficiency: Secondary | ICD-10-CM | POA: Diagnosis present

## 2018-09-05 LAB — CBC
HCT: 30 % — ABNORMAL LOW (ref 36.0–46.0)
Hemoglobin: 9.3 g/dL — ABNORMAL LOW (ref 12.0–15.0)
MCH: 26.9 pg (ref 26.0–34.0)
MCHC: 31 g/dL (ref 30.0–36.0)
MCV: 86.7 fL (ref 80.0–100.0)
Platelets: 294 10*3/uL (ref 150–400)
RBC: 3.46 MIL/uL — ABNORMAL LOW (ref 3.87–5.11)
RDW: 16.9 % — ABNORMAL HIGH (ref 11.5–15.5)
WBC: 7.8 10*3/uL (ref 4.0–10.5)
nRBC: 0 % (ref 0.0–0.2)

## 2018-09-05 LAB — URINALYSIS, ROUTINE W REFLEX MICROSCOPIC
Bilirubin Urine: NEGATIVE
Glucose, UA: NEGATIVE mg/dL
Ketones, ur: NEGATIVE mg/dL
Nitrite: POSITIVE — AB
Protein, ur: NEGATIVE mg/dL
Specific Gravity, Urine: 1.009 (ref 1.005–1.030)
pH: 5 (ref 5.0–8.0)

## 2018-09-05 LAB — IRON AND TIBC
Iron: 20 ug/dL — ABNORMAL LOW (ref 28–170)
Saturation Ratios: 4 % — ABNORMAL LOW (ref 10.4–31.8)
TIBC: 450 ug/dL (ref 250–450)
UIBC: 430 ug/dL

## 2018-09-05 LAB — BASIC METABOLIC PANEL
Anion gap: 10 (ref 5–15)
BUN: 23 mg/dL (ref 8–23)
CO2: 23 mmol/L (ref 22–32)
Calcium: 8.7 mg/dL — ABNORMAL LOW (ref 8.9–10.3)
Chloride: 103 mmol/L (ref 98–111)
Creatinine, Ser: 0.87 mg/dL (ref 0.44–1.00)
GFR calc Af Amer: >60 mL/min (ref >60)
GFR calc non Af Amer: >60 mL/min (ref >60)
Glucose, Bld: 178 mg/dL — ABNORMAL HIGH (ref 70–99)
Potassium: 2.9 mmol/L — ABNORMAL LOW (ref 3.5–5.1)
Sodium: 136 mmol/L (ref 135–145)

## 2018-09-05 LAB — CBG MONITORING, ED: Glucose-Capillary: 169 mg/dL — ABNORMAL HIGH (ref 70–99)

## 2018-09-05 LAB — VITAMIN B12: Vitamin B-12: 323 pg/mL (ref 180–914)

## 2018-09-05 LAB — FERRITIN: Ferritin: 11 ng/mL (ref 11–307)

## 2018-09-05 LAB — C DIFFICILE QUICK SCREEN W PCR REFLEX
C Diff antigen: NEGATIVE
C Diff interpretation: NOT DETECTED
C Diff toxin: NEGATIVE

## 2018-09-05 LAB — RETICULOCYTES
Immature Retic Fract: 16.7 % — ABNORMAL HIGH (ref 2.3–15.9)
RBC.: 3.41 MIL/uL — ABNORMAL LOW (ref 3.87–5.11)
Retic Count, Absolute: 63.4 10*3/uL (ref 19.0–186.0)
Retic Ct Pct: 1.9 % (ref 0.4–3.1)

## 2018-09-05 LAB — POC OCCULT BLOOD, ED: Fecal Occult Bld: NEGATIVE

## 2018-09-05 LAB — FOLATE: Folate: 13.9 ng/mL (ref >5.9)

## 2018-09-05 LAB — SARS CORONAVIRUS 2 BY RT PCR (HOSPITAL ORDER, PERFORMED IN ~~LOC~~ HOSPITAL LAB): SARS Coronavirus 2: NEGATIVE

## 2018-09-05 LAB — MAGNESIUM: Magnesium: 2 mg/dL (ref 1.7–2.4)

## 2018-09-05 MED ORDER — SODIUM CHLORIDE 0.9 % IV SOLN
1.0000 g | Freq: Once | INTRAVENOUS | Status: AC
  Administered 2018-09-05: 1 g via INTRAVENOUS
  Filled 2018-09-05: qty 10

## 2018-09-05 MED ORDER — SODIUM CHLORIDE 0.9 % IV SOLN
80.0000 mg | Freq: Once | INTRAVENOUS | Status: AC
  Administered 2018-09-05: 80 mg via INTRAVENOUS
  Filled 2018-09-05: qty 80

## 2018-09-05 MED ORDER — POTASSIUM CHLORIDE 10 MEQ/100ML IV SOLN
10.0000 meq | INTRAVENOUS | Status: AC
  Administered 2018-09-05 – 2018-09-06 (×4): 10 meq via INTRAVENOUS
  Filled 2018-09-05 (×4): qty 100

## 2018-09-05 MED ORDER — SERTRALINE HCL 25 MG PO TABS
25.0000 mg | ORAL_TABLET | Freq: Every day | ORAL | Status: DC
  Administered 2018-09-05 – 2018-09-06 (×2): 25 mg via ORAL
  Filled 2018-09-05 (×2): qty 1

## 2018-09-05 MED ORDER — PANTOPRAZOLE SODIUM 40 MG IV SOLR
40.0000 mg | Freq: Two times a day (BID) | INTRAVENOUS | Status: DC
  Administered 2018-09-05 – 2018-09-07 (×3): 40 mg via INTRAVENOUS
  Filled 2018-09-05 (×4): qty 40

## 2018-09-05 MED ORDER — SODIUM CHLORIDE 0.9 % IV BOLUS
1000.0000 mL | Freq: Once | INTRAVENOUS | Status: AC
  Administered 2018-09-05: 1000 mL via INTRAVENOUS

## 2018-09-05 MED ORDER — POTASSIUM CHLORIDE 10 MEQ/100ML IV SOLN
10.0000 meq | INTRAVENOUS | Status: AC
  Administered 2018-09-05 (×2): 10 meq via INTRAVENOUS
  Filled 2018-09-05 (×2): qty 100

## 2018-09-05 MED ORDER — ACETAMINOPHEN 325 MG PO TABS: 650.0000 mg | ORAL_TABLET | Freq: Four times a day (QID) | ORAL | Status: DC | PRN

## 2018-09-05 MED ORDER — SODIUM CHLORIDE 0.9 % IV SOLN
1.0000 g | INTRAVENOUS | Status: DC
  Administered 2018-09-06: 1 g via INTRAVENOUS
  Filled 2018-09-05: qty 1

## 2018-09-05 MED ORDER — SODIUM CHLORIDE 0.9% FLUSH
3.0000 mL | Freq: Once | INTRAVENOUS | Status: AC
  Administered 2018-09-05: 12:00:00 3 mL via INTRAVENOUS

## 2018-09-05 MED ORDER — SODIUM CHLORIDE 0.9 % IV SOLN
INTRAVENOUS | Status: DC
  Administered 2018-09-05: 23:00:00 via INTRAVENOUS

## 2018-09-05 MED ORDER — ACETAMINOPHEN 650 MG RE SUPP: 650.0000 mg | Freq: Four times a day (QID) | RECTAL | Status: DC | PRN

## 2018-09-05 MED ORDER — ONDANSETRON HCL 4 MG PO TABS: 4.0000 mg | ORAL_TABLET | Freq: Four times a day (QID) | ORAL | Status: DC | PRN

## 2018-09-05 MED ORDER — ONDANSETRON HCL 4 MG/2ML IJ SOLN: 4.0000 mg | Freq: Four times a day (QID) | INTRAMUSCULAR | Status: DC | PRN

## 2018-09-05 NOTE — ED Triage Notes (Signed)
Pt reports she has been having abdominal pain and her PCP prescribed Carafate. Pt reports she had some relief and then she started having the pain again and she had a near syncopal episode and has been having N/V/D.  Pt reports she feels like she is about to pass out now. Pt is pale and having dyspnea with any exertion.

## 2018-09-05 NOTE — H&P (Addendum)
[name] TIR:[id number] DOB: 08/14/1932 DOA: 09/05/2018     PCP: Lajean Manes, MD   Outpatient Specialists:   CARDS:  Dr. Marlou Porch    Oncology   Dr. Lynnette Caffey at [place]  Dr. Watt Climes  [facility], )    Patient arrived to ER on 09/05/18 at 1058  Patient coming from: home Lives  With family    Chief Complaint:  Chief Complaint  Patient presents with  . Abdominal Pain  . Hypotension  . Near Syncope    HPI: [name] is a 85 y.o. female with medical history significant of pancreatic cancer status post Whipple procedure in 2006 with recurrence in treatment in 2008 now in remission, history of C. difficile, HLD, hypothyroidism, vertigo  peripheral vascular disease  Presented with   Epigastric pain persistent not radiating she try to use Carafate but it made her stools dark and did not help she had a bit of diarrhea but no blood in stool no vomiting no chest pain or shortness of breath no URI symptoms today she felt lightheaded when she was trying to stand up.  Reports she has been having epigastric pain for few weeks Dr. Ulis Rias ordered XRAY and started her on Carafate That did not seem to help at all. She attributed her dizziness and diarrhea to the Carafate She stopped taking it on Sunday Therapist came today and took  Her BP it was 60/40 so the physical therapist told her to go to ER    Infectious risk factors:  Reports abdominal pain,      In  ER RAPID COVID TEST NEGATIVE    Regarding pertinent Chronic problems:    pancreatic cancer post Whipple procedure 2006, came back 2008 went to [facility] and joined trial (tumor was in the liver but disappeared),   Status post Whipple procedure and Questran      HTN on lisinopril, dose Lasix because of increasing edema of her feet     aTypical chest pain- negative stress test                -  followed by cardiology         Hypothyroidism:  Lab Results  Component Value Date   TSH 1.49 05/27/2011   on synthroid      While in ER: Emergency department blood pressure 95/44 Due to be having hypokalemia down to 2.9 Elevated BUN up to 23  occult negative  hemoglobin 9.3 UA worrisome for possible UTI with many bacteria and elevated white blood cell count Scan of abdomen showed inflammatory changes around prior anastomotic site Patient was given Protonix IV plan to admit for possible marginal anastomotic ulcer    The following Work up has been ordered so far:  Orders Placed This Encounter  Procedures  . C difficile quick scan w PCR reflex  . SARS Coronavirus [facility] order, Performed in [facility] hospital lab) Nasopharyngeal Nasopharyngeal Swab  . Urine Culture  . CT Abdomen Pelvis Wo Contrast  . Basic metabolic panel  . CBC  . Urinalysis, Routine w reflex microscopic  . Vitamin B12  . Folate  . Iron and TIBC  . Ferritin  . Reticulocytes  . Magnesium  . Magnesium  . Phosphorus  . TSH  . Comprehensive metabolic panel  . CBC  . Diet NPO time specified  . Diet clear liquid Room service appropriate? Yes; Fluid consistency: Thin  . Cardiac monitoring  . Saline Lock IV, Maintain IV access  .  In and Out Cath  . Cardiac monitoring  . Vital signs  . Notify physician  . Up with assistance  . If patient diabetic or glucose greater than 140 notify physician for Sliding Scale Insulin Orders  . May go off telemetry for tests/procedures  . Oral care per nursing protocol  . Initiate Oral Care Protocol  . Initiate Carrier Fluid Protocol  . RN may order General Admission PRN Orders utilizing "General Admission PRN medications" (through manage orders) for the following patient needs: allergy symptoms (Claritin), cold sores (Carmex), cough (Robitussin DM), eye irritation (Liquifilm Tears), hemorrhoids (Tucks), indigestion (Maalox), minor skin irritation (Hydrocortisone Cream), muscle pain Suezanne Jacquet Gay), nose irritation (saline nasal spray) and sore throat (Chloraseptic spray).  . SCDs  . Patient  has an active order for admit to inpatient/place in observation  . Do not attempt resuscitation (DNR)  . Consult to hospitalist  ALL PATIENTS BEING ADMITTED/HAVING PROCEDURES NEED COVID-19 SCREENING  . Consult to general surgery  ALL PATIENTS BEING ADMITTED/HAVING PROCEDURES NEED COVID-19 SCREENING  . Consult to hospitalist  ALL PATIENTS BEING ADMITTED/HAVING PROCEDURES NEED COVID-19 SCREENING  . Consult to gastroenterology  ALL PATIENTS BEING ADMITTED/HAVING PROCEDURES NEED COVID-19 SCREENING  . Consult to gastroenterology  [place] gi doctor on call (not Dr Collene Mares, but rather the doctor on call for [facility])  . Consult to gastroenterology  [place] gi doctor on call (not Dr Collene Mares, but rather the doctor on call for [facility] GI)  . OT eval and treat  . PT eval and treat  . Pulse oximetry, continuous  . Pulse oximetry check with vital signs  . Oxygen therapy Mode or (Route): Nasal cannula; Liters Per Minute: 2; Keep 02 saturation: greater than 92 %  . Incentive spirometry  . CBG monitoring, ED  . POC occult blood, ED Provider will collect  . EKG 12-Lead  . ED EKG  . EKG 12-Lead  . EKG 12-Lead  . Place in observation (patient's expected length of stay will be less than 2 midnights)     Following Medications were ordered in ER: Medications  cefTRIAXone (ROCEPHIN) 1 g in sodium chloride 0.9 % 100 mL IVPB (has no administration in time range)  potassium chloride 10 mEq in 100 mL IVPB (has no administration in time range)  sertraline (ZOLOFT) tablet 25 mg (has no administration in time range)  acetaminophen (TYLENOL) tablet 650 mg (has no administration in time range)    Or  acetaminophen (TYLENOL) suppository 650 mg (has no administration in time range)  ondansetron (ZOFRAN) tablet 4 mg (has no administration in time range)    Or  ondansetron (ZOFRAN) injection 4 mg (has no administration in time range)  0.9 %  sodium chloride infusion (has no administration in time range)  sodium chloride flush  (NS) 0.9 % injection 3 mL (3 mLs Intravenous Given 09/05/18 1141)  sodium chloride 0.9 % bolus 1,000 mL (0 mLs Intravenous Stopped 09/05/18 1942)  pantoprazole (PROTONIX) 80 mg in sodium chloride 0.9 % 100 mL IVPB (0 mg Intravenous Stopped 09/05/18 1758)  cefTRIAXone (ROCEPHIN) 1 g in sodium chloride 0.9 % 100 mL IVPB (0 g Intravenous Stopped 09/05/18 1758)  potassium chloride 10 mEq in 100 mL IVPB (0 mEq Intravenous Stopped 09/05/18 2142)        Consult Orders  (From admission, onward)         Start     Ordered   09/05/18 2158  PT eval and treat  Routine     09/05/18 2158  09/05/18 2158  OT eval and treat  Routine    Question:  Reason for OT?  Answer:  debility   09/05/18 2158   09/05/18 1825  Consult to hospitalist  ALL PATIENTS BEING ADMITTED/HAVING PROCEDURES NEED COVID-19 SCREENING  Once    Comments: ALL PATIENTS BEING ADMITTED/HAVING PROCEDURES NEED COVID-19 SCREENING  Provider:  (Not yet assigned)  Question Answer Comment  Place call to: [facility]   Reason for Consult Admit      09/05/18 1824   09/05/18 1643  Consult to hospitalist  ALL PATIENTS BEING ADMITTED/HAVING PROCEDURES NEED COVID-19 SCREENING  Once    Comments: ALL PATIENTS BEING ADMITTED/HAVING PROCEDURES NEED COVID-19 SCREENING  Provider:  (Not yet assigned)  Question Answer Comment  Place call to: [facility]   Reason for Consult Admit      09/05/18 1643          ER Provider Called: General surgery     They Recommend admit to medicine.there was no surgical intervention indicated at this time please reconsult them if needed Commanding stent GI consult  ER Provider Called: GI Dr Paulita Fujita will see pt in AM    Significant initial  Findings: Abnormal Labs Reviewed  BASIC METABOLIC PANEL - Abnormal; Notable for the following components:      Result Value   Potassium 2.9 (*)    Glucose, Bld 178 (*)    Calcium 8.7 (*)    All other components within normal limits  CBC - Abnormal; Notable for the  following components:   RBC 3.46 (*)    Hemoglobin 9.3 (*)    HCT 30.0 (*)    RDW 16.9 (*)    All other components within normal limits  URINALYSIS, ROUTINE W REFLEX MICROSCOPIC - Abnormal; Notable for the following components:   APPearance HAZY (*)    Hgb urine dipstick SMALL (*)    Nitrite POSITIVE (*)    Leukocytes,Ua TRACE (*)    Bacteria, UA MANY (*)    All other components within normal limits  IRON AND TIBC - Abnormal; Notable for the following components:   Iron 20 (*)    Saturation Ratios 4 (*)    All other components within normal limits  RETICULOCYTES - Abnormal; Notable for the following components:   RBC. 3.41 (*)    Immature Retic Fract 16.7 (*)    All other components within normal limits  CBG MONITORING, ED - Abnormal; Notable for the following components:   Glucose-Capillary 169 (*)    All other components within normal limits     Otherwise labs showing:    Recent Labs  Lab 09/05/18 1124  NA 136  K 2.9*  CO2 23  GLUCOSE 178*  BUN 23  CREATININE 0.87  CALCIUM 8.7*  MG 2.0    Cr    stable,    Lab Results  Component Value Date   CREATININE 0.87 09/05/2018   CREATININE 0.8 05/27/2011   CREATININE 0.81 10/21/2010    No results for input(s): AST, ALT, ALKPHOS, BILITOT, PROT, ALBUMIN in the last 168 hours. Lab Results  Component Value Date   CALCIUM 8.7 (L) 09/05/2018      WBC      Component Value Date/Time   WBC 7.8 09/05/2018 1124   ANC    Component Value Date/Time   NEUTROABS 3.5 05/27/2011 1525   ALC No results found for: LYMPHOABS    Plt: Lab Results  Component Value Date   PLT 294 09/05/2018  COVID-19 Labs  Recent Labs    09/05/18 1428  FERRITIN 11    Lab Results  Component Value Date   SARSCOV2NAA NEGATIVE 09/05/2018     HG/HCT   Down   from baseline 13.6 in 2013    Component Value Date/Time   HGB 9.3 (L) 09/05/2018 1124   HCT 30.0 (L) 09/05/2018 1124         CBG (last 3)  Recent Labs    09/05/18  1134  GLUCAP 169*     UA evidence of UTI      Urine analysis:    Component Value Date/Time   COLORURINE YELLOW 09/05/2018 1507   APPEARANCEUR HAZY (A) 09/05/2018 1507   LABSPEC 1.009 09/05/2018 1507   PHURINE 5.0 09/05/2018 1507   GLUCOSEU NEGATIVE 09/05/2018 1507   HGBUR SMALL (A) 09/05/2018 1507   BILIRUBINUR NEGATIVE 09/05/2018 1507   KETONESUR NEGATIVE 09/05/2018 1507   PROTEINUR NEGATIVE 09/05/2018 1507   UROBILINOGEN 0.2 10/21/2010 1717   NITRITE POSITIVE (A) 09/05/2018 1507   LEUKOCYTESUR TRACE (A) 09/05/2018 1507    CTabd/pelvis -There is nonspecific apparent thickening and phlegmonous change about the gastrojejunostomy site but this is poorly assessed given underdistention and lack of contrast medium inflammation or ulceration anastomotic site.     ECG:  Personally reviewed by me showing: HR : 70 Rhythm:  NSR,   nonspecific changes, no evidence of ischemic changes QTC 463     ED Triage Vitals [09/05/18 1110]  Enc Vitals Group     BP (!) 95/44     Pulse Rate 73     Resp 14     Temp 97.6 F (36.4 C)     Temp Source Oral     SpO2 100 %     Weight      Height      Head Circumference      Peak Flow      Pain Score      Pain Loc      Pain Edu?      Excl. in [place]?   WUJW(11)@       Latest  Blood pressure 136/79, pulse 60, temperature 97.6 F (36.4 C), temperature source Oral, resp. rate 16, SpO2 96 %.    Hospitalist was called for admission for dehydration normal CT scan   Review of Systems:    Pertinent positives include:  Fatigue abdominal pain,  Constitutional:  No weight loss, night sweats, Fevers, chills, weight loss  HEENT:  No headaches, Difficulty swallowing,Tooth/dental problems,Sore throat,  No sneezing, itching, ear ache, nasal congestion, post nasal drip,  Cardio-vascular:  No chest pain, Orthopnea, PND, anasarca, dizziness, palpitations.no Bilateral lower extremity swelling  GI:  No heartburn, indigestion,  nausea, vomiting,  diarrhea, change in bowel habits, loss of appetite, melena, blood in stool, hematemesis Resp:  no shortness of breath at rest. No dyspnea on exertion, No excess mucus, no productive cough, No non-productive cough, No coughing up of blood.No change in color of mucus.No wheezing. Skin:  no rash or lesions. No jaundice GU:  no dysuria, change in color of urine, no urgency or frequency. No straining to urinate.  No flank pain.  Musculoskeletal:  No joint pain or no joint swelling. No decreased range of motion. No back pain.  Psych:  No change in mood or affect. No depression or anxiety. No memory loss.  Neuro: no localizing neurological complaints, no tingling, no weakness, no double vision, no gait abnormality, no slurred speech, no confusion  All systems  reviewed and apart from [place] all are negative  Past Medical History:   Past Medical History:  Diagnosis Date  . Aortic valve sclerosis   . C. difficile colitis   . Hyperlipidemia   . Hypothyroid   . Mild recurrent major depression ([place])   . Osteopenia   . Pancreatic cancer [facility])    Pancreatic  . Vertigo   . Vitamin D deficiency       Past Surgical History:  Procedure Laterality Date  . BLADDER SUSPENSION  1999  . CATARACT EXTRACTION  2011   bilateral  . CHOLECYSTECTOMY  1992  . lower back surgery  1994, 1995   L4-5  . multiple hernias  2007, 2012   ventral  . PARTIAL HYSTERECTOMY  1973  . SHOULDER ARTHROSCOPY  2011   rt  . TONSILLECTOMY  1942  . WHIPPLE PROCEDURE  2006    Social History:  Ambulatory   independently       reports that she has never smoked. She has never used smokeless tobacco. She reports current alcohol use. She reports that she does not use drugs.   Family History:   Family History  Problem Relation Age of Onset  . Heart disease Mother   . Heart failure Mother   . Hypertension Mother   . Heart attack Father   . Hypertension Sister   . Cancer Sister   . Breast cancer Sister   . Heart  disease Maternal Grandmother   . Cancer Maternal Grandfather   . Hypertension Sister     Allergies: Allergies  Allergen Reactions  . Ivp Dye [Iodinated Diagnostic Agents] Anaphylaxis    Pet scan Gastrografin per patient Irven Baltimore, RN  . Amoxicillin Nausea Only and Other (See Comments)    "Knocks her flat" per pt Did it involve swelling of the face/tongue/throat, SOB, or low BP? No Did it involve sudden or severe rash/hives, skin peeling, or any reaction on the inside of your mouth or nose? No Did you need to seek medical attention at a hospital or doctor's office? No When did it last happen?Over 10 years ago If all above answers are "NO", may proceed with cephalosporin use.  . Ciprofloxacin Nausea And Vomiting  . Dextromethorphan Polistirex Er Nausea And Vomiting  . Diatrizoate Meglumine & Sodium Other (See Comments)    Unknown rxn  . Flagyl [Metronidazole] Nausea And Vomiting  . Hydrocodone Nausea And Vomiting  . Methocarbamol Nausea And Vomiting  . Oxycodone Nausea And Vomiting  . Prednisone Nausea And Vomiting    Able to tolerate low doses  . Promethazine Hcl Nausea And Vomiting  . Sucralfate Diarrhea, Nausea Only and Other (See Comments)    Stomach pain and dizziness  . Tape Other (See Comments)    Skin tearing and irritation     Prior to Admission medications   Medication Sig Start Date End Date Taking? Authorizing Provider  Ascorbic Acid (VITAMIN C) 1000 MG tablet Take 1,000 mg by mouth daily.   Yes [provider]  cholestyramine Lucrezia Starch) 4 GM/DOSE powder Take 4 g by mouth 2 (two) times daily as needed (diarrhea).    Yes [provider]  diclofenac sodium (VOLTAREN) 1 % GEL Apply 2 g topically daily as needed (pain).    Yes [provider]  Glycerin-Hypromellose-PEG 400 (DRY EYE RELIEF DROPS) 0.2-0.2-1 % SOLN Apply 1 drop to eye 2 (two) times daily.   Yes [provider]  lisinopril (ZESTRIL) 10 MG tablet Take 10 mg by mouth  daily. 07/03/17  Yes [provider]  Multiple Vitamins-Minerals (COMPLETE MULTIVITAMIN/MINERAL PO) Take 1 tablet by mouth daily.   Yes [provider]  Probiotic Product (PROBIOTIC DAILY PO) Take 1 capsule by mouth daily.    Yes [provider]  sertraline (ZOLOFT) 50 MG tablet Take 25 mg by mouth at bedtime. 08/21/18  Yes [provider]  Vitamin D, Ergocalciferol, (DRISDOL) 50000 UNITS CAPS Take 50,000 Units by mouth 2 (two) times a week. Every Wednesday and Saturday     Yes [provider]   Physical Exam: Blood pressure 136/79, pulse 60, temperature 97.6 F (36.4 C), temperature source Oral, resp. rate 16, SpO2 96 %. 1. General:  in No  Acute distress    Chronically ill  -appearing 2. Psychological: Alert and  Oriented 3. Head/ENT:     Dry Mucous Membranes                          Head Non traumatic, neck supple                          Poor Dentition 4. SKIN:   decreased Skin turgor,  Skin clean Dry and intact no rash 5. Heart: Regular rate and rhythm no Murmur, no Rub or gallop 6. Lungs: Clear to auscultation bilaterally, no wheezes or crackles   7. Abdomen: Soft, non-tender, Non distended  bowel sounds present 8. Lower extremities: no clubbing, cyanosis, no  edema 9. Neurologically Grossly intact, moving all 4 extremities equally   10. MSK: Normal range of motion   All other LABS:     Recent Labs  Lab 09/05/18 1124  WBC 7.8  HGB 9.3*  HCT 30.0*  MCV 86.7  PLT 294     Recent Labs  Lab 09/05/18 1124  NA 136  K 2.9*  CL 103  CO2 23  GLUCOSE 178*  BUN 23  CREATININE 0.87  CALCIUM 8.7*  MG 2.0     No results for input(s): AST, ALT, ALKPHOS, BILITOT, PROT, ALBUMIN in the last 168 hours.     Cultures: No results found for: SDES, SPECREQUEST, CULT, REPTSTATUS   Radiological Exams on Admission: Ct Abdomen Pelvis Wo Contrast  Result Date: 09/05/2018 CLINICAL DATA:  Generalized abdominal pain, nausea, vomiting,  diarrhea for several days. History of pancreatic cancer with Whipple in 2007 EXAM: CT ABDOMEN AND PELVIS WITHOUT CONTRAST TECHNIQUE: Multidetector CT imaging of the abdomen and pelvis was performed following the standard protocol without IV contrast. COMPARISON:  None. FINDINGS: Lower chest: Basilar areas of atelectasis and/or scarring. Coronary calcifications. Calcifications of the aortic leaflet and mitral annulus. Cardiac size is normal. No pericardial effusion Hepatobiliary: Mild intra and extrahepatic biliary ductal dilatation postsurgical features of the Whipple procedure. Pneumobilia seen centrally within the left hepatic lobe. No concerning hepatic mass. Pancreas: Partial pancreatectomy changes from Whipple. Distal body and tail pancreatic atrophy. Difficult to assess the morphology of the surgical site in the absence of contrast medium. Spleen: Normal in size without focal abnormality. Adrenals/Urinary Tract: Adrenal glands are normal. Vascular calcium seen in the renal sinuses. Kidneys are otherwise, without renal calculi, suspicious lesion, or hydronephrosis. Bladder is largely decompressed at the time of exam and therefore poorly assessed by CT. Stomach/Bowel: Thickening of the distal esophagus. Stomach is distended with ingested material contrast medium. There is apparent thickening at the gastrojejunostomy site but this is poorly assessed given underdistention and lack of contrast. Passage of  contrast media beyond this point. Adjacent inflammatory and phlegmonous features are present near the efferent limb. More distal small bowel is normal. The cecum is displaced into the right upper quadrant with the air-filled appendix along the under surface of the liver. Scattered colonic diverticula without focal pericolonic inflammation to suggest diverticulitis. Vascular/Lymphatic: Atherosclerotic plaque within the normal caliber aorta. Reactive lymph nodes present in the upper abdomen adjacent the inflammatory  features near the gastrojejunostomy site. Reproductive: Uterus is surgically absent. No concerning adnexal lesions. Other: No abdominopelvic free fluid or free gas. No bowel containing hernias. Intermediate attenuation thickening along the anterior abdominal wall anterior to the gastric antrum. Musculoskeletal: Multilevel degenerative changes are present in the imaged portions of the spine. Findings are maximal at the thoracolumbar junction. Additional degenerative changes in the SI joints and hips. No acute osseous abnormality or suspicious osseous lesion. IMPRESSION: 1. Partial pancreatectomy changes from Whipple procedure. There is nonspecific apparent thickening and phlegmonous change about the gastrojejunostomy site but this is poorly assessed given underdistention and lack of contrast medium. Differential would include inflammation or ulceration anastomotic site. No free air or abscess formation. No evidence of obstruction. 2. Intermediate attenuation thickening along the anterior abdominal wall anterior to the gastric antrum, favored to reflect surgical material. Correlate with surgical notes. 3. Suboptimal assessment of the surgical bed in the absence of contrast medium and given lack of comparison imaging. 4.  Aortic Atherosclerosis (ICD10-I70.0). These results were called by telephone at the time of interpretation on 09/05/2018 at 4:40 pm to Dr. Lajean Saver , who verbally acknowledged these results. Electronically Signed   By: Lovena Le M.D.   On: 09/05/2018 16:41    Chart has been reviewed    Assessment/Plan  85 y.o. female with medical history significant of pancreatic cancer status post Whipple procedure in 2006 with recurrence in treatment in 2008 now in remission, history of C. difficile, HLD, hypothyroidism, vertigo  peripheral vascular disease  Admitted for abnormal CT with possible underlining peptic ulcer disease  Present on Admission: . Abnormal CT of the abdomen with phlegmon  versus underlying peptic ulcer disease.  Appreciate GI input general surgery has been made aware while in emergency department thought there was no indication for surgical intervention at this time.  Patient not febrile no evidence of elevated white blood cell, hold off of IV antibiotics for now Continue with Protonix n.p.o. postmidnight if may need further investigation studies  . Hypokalemia we will replace check magnesium level . Abdominal pain currently improving continue to monitor likely secondary to underlying peptic ulcer disease versus phlegmon  . HTN (hypertension) - hold lisinopril allow permissive hypertension . Pyuria -denies any history of dysuria await results of urine culture patient already started on Rocephin while hospitalized await results of urine cultures and readjust as needed . Dehydration - will rehydrate  Hypothyrodism - no longer on synthroid Other plan as per orders.  DVT prophylaxis:  SCD    Code Status:  DNR/DNI  as per patient   I had personally discussed CODE STATUS with patient  Family Communication:   Family not at  Bedside  plan of care was discussed   with  Daughter   Disposition Plan:    To home once workup is complete and patient is stable                     Would benefit from PT/OT eval prior to DC  Ordered  Consults called:   general surgery  Aware please re consult if needed Discussed with  Gi   Admission status:  ED Disposition    ED Disposition Condition [place]: [place] [[id number]]  Level of Care: Telemetry [5]  Admit to tele based on following criteria: Other see comments  Comments: presyncope  Covid Evaluation: Confirmed COVID Negative  Diagnosis: Abdominal pain [[id number]]  Admitting Physician: Toy Baker [[id number]]  Attending Physician: Toy Baker [[id number]]  PT Class (Do Not Modify): Observation [104]  PT Acc Code (Do Not Modify): Observation  [10022]        Obs    Level of care     tele    24H    Precautions:  enteric,   No active isolations  PPE: Used by the provider:   P100  eye Goggles,  Gloves     Dustan Hyams 09/05/2018, 10:22 PM    [facility]     after 2 AM please page floor coverage PA If 7AM-7PM, please contact the day team taking care of the patient using [email or web address]

## 2018-09-05 NOTE — ED Provider Notes (Addendum)
[facility] DEPT Provider Note   CSN: [id number] Arrival date & time: 09/05/18  1058     History   Chief Complaint Chief Complaint  Patient presents with  . Abdominal Pain  . Hypotension  . Near Syncope    HPI [name] is a 85 y.o. female.     Patient c/o epigastric pain for the past few days. Symptoms acute onset, dull, moderate, persistent, non radiating. Had tried carafate, but feels that made her stools very dark/black, and generally made her feel poorly. Has had a few loose stools/diarrhea. No severe or bloody diarrhea. No vomiting. Denies dysuria or gu c/o. No chest pain or sob. No cough or uri symptoms. Denies hx gi bleeding. Although stools black in color, states not tarry or particularly malodorous. Today felt weak/faint when standing.   Patient with hx pancreatic cancer, had Whipple procedure in 2006, states subsequent recurrence with gene therapy tx 2008, but has been pancreatic cancer free since then.  Patient did subsequently add to history that in the 1-2 weeks prior to recent symptom onset, she had been taking a fair amount of ibuprofen.   The history is provided by the patient and a relative.  Abdominal Pain Associated symptoms: diarrhea   Associated symptoms: no chest pain, no cough, no dysuria, no fever, no shortness of breath and no sore throat   Near Syncope Associated symptoms include abdominal pain. Pertinent negatives include no chest pain, no headaches and no shortness of breath.    Past Medical History:  Diagnosis Date  . Aortic valve sclerosis   . C. difficile colitis   . Hyperlipidemia   . Hypothyroid   . Mild recurrent major depression ([place])   . Osteopenia   . Pancreatic cancer [facility])    Pancreatic  . Vertigo   . Vitamin D deficiency     There are no active problems to display for this patient.   Past Surgical History:  Procedure Laterality Date  . BLADDER SUSPENSION  1999  . CATARACT EXTRACTION   2011   bilateral  . CHOLECYSTECTOMY  1992  . lower back surgery  1994, 1995   L4-5  . multiple hernias  2007, 2012   ventral  . PARTIAL HYSTERECTOMY  1973  . SHOULDER ARTHROSCOPY  2011   rt  . TONSILLECTOMY  1942  . WHIPPLE PROCEDURE  2006     OB History   No obstetric history on file.      Home Medications    Prior to Admission medications   Medication Sig Start Date End Date Taking? Authorizing Provider  cholestyramine (QUESTRAN) 4 GM/DOSE powder Take 4 g by mouth 2 (two) times daily as needed.    [provider]  citalopram (CELEXA) 10 MG tablet Take 10 mg by mouth daily.    [provider]  cycloSPORINE (RESTASIS) 0.05 % ophthalmic emulsion Place 1 drop into both eyes 2 (two) times daily.    [provider]  diclofenac sodium (VOLTAREN) 1 % GEL Apply 2 g topically daily as needed.    [provider]  furosemide (LASIX) 20 MG tablet Take 20 mg by mouth daily. 06/21/18   [provider]  lisinopril (ZESTRIL) 10 MG tablet Take 10 mg by mouth daily. 07/03/17   [provider]  Multiple Vitamins-Minerals (COMPLETE MULTIVITAMIN/MINERAL PO) Take 1 tablet by mouth daily.    [provider]  Probiotic Product (PROBIOTIC DAILY PO) Take by mouth.    [provider]  Trace  Min [id number] (MINERALS PO) TAKE 4 TABLETS BY MOUTH DAILY    [provider]  Vitamin D, Ergocalciferol, (DRISDOL) 50000 UNITS CAPS Take 50,000 Units by mouth. Every Wednesday and Saturday    [provider]    Family History Family History  Problem Relation Age of Onset  . Heart disease Mother   . Heart failure Mother   . Hypertension Mother   . Heart attack Father   . Hypertension Sister   . Cancer Sister   . Breast cancer Sister   . Heart disease Maternal Grandmother   . Cancer Maternal Grandfather   . Hypertension Sister     Social History Social History   Tobacco Use  . Smoking status: Never Smoker  .  Smokeless tobacco: Never Used  Substance Use Topics  . Alcohol use: Yes  . Drug use: Never     Allergies   Ivp dye [iodinated diagnostic agents], Amoxicillin, Ciprofloxacin, Dextromethorphan polistirex er, Diatrizoate meglumine & sodium, Flagyl [metronidazole], Hydrocodone, Latex, Methocarbamol, Oxycodone, Prednisone, Promethazine hcl, and Tape   Review of Systems Review of Systems  Constitutional: Negative for fever.  HENT: Negative for sore throat.   Eyes: Negative for redness.  Respiratory: Negative for cough and shortness of breath.   Cardiovascular: Positive for near-syncope. Negative for chest pain, palpitations and leg swelling.  Gastrointestinal: Positive for abdominal pain and diarrhea.  Endocrine: Negative for polyuria.  Genitourinary: Negative for dysuria and flank pain.  Musculoskeletal: Negative for back pain and neck pain.  Skin: Negative for rash.  Neurological: Positive for light-headedness. Negative for headaches.  Hematological: Does not bruise/bleed easily.  Psychiatric/Behavioral: Negative for confusion.     Physical Exam Updated Vital Signs BP (!) 118/47   Pulse 65   Temp 97.6 F (36.4 C) (Oral)   Resp 17   SpO2 99%   Physical Exam Vitals signs and nursing note reviewed.  Constitutional:      Appearance: Normal appearance. She is well-developed.  HENT:     Head: Atraumatic.     Nose: Nose normal.     Mouth/Throat:     Mouth: Mucous membranes are moist.  Eyes:     General: No scleral icterus.    Conjunctiva/sclera: Conjunctivae normal.  Neck:     Musculoskeletal: Normal range of motion and neck supple. No neck rigidity or muscular tenderness.     Trachea: No tracheal deviation.  Cardiovascular:     Rate and Rhythm: Normal rate and regular rhythm.     Pulses: Normal pulses.     Heart sounds: Normal heart sounds. No murmur. No friction rub. No gallop.   Pulmonary:     Effort: Pulmonary effort is normal. No respiratory distress.     Breath  sounds: Normal breath sounds.  Abdominal:     General: Bowel sounds are normal. There is no distension.     Palpations: Abdomen is soft. There is no mass.     Tenderness: There is abdominal tenderness. There is no guarding or rebound.     Hernia: No hernia is present.     Comments: Epigastric tenderness.   Genitourinary:    Comments: No cva tenderness. Medium brown stool, heme neg.  Musculoskeletal:        General: No swelling or tenderness.     Right lower leg: No edema.     Left lower leg: No edema.  Skin:    General: Skin is warm and dry.     Findings: No rash.  Neurological:  Mental Status: She is alert.     Comments: Alert, speech normal.   Psychiatric:        Mood and Affect: Mood normal.      ED Treatments / Results  Labs (all labs ordered are listed, but only abnormal results are displayed) Results for orders placed or performed during the hospital encounter of 29/51/88  Basic metabolic panel  Result Value Ref Range   Sodium 136 135 - 145 mmol/L   Potassium 2.9 (L) 3.5 - 5.1 mmol/L   Chloride 103 98 - 111 mmol/L   CO2 23 22 - 32 mmol/L   Glucose, Bld 178 (H) 70 - 99 mg/dL   BUN 23 8 - 23 mg/dL   Creatinine, Ser 0.87 0.44 - 1.00 mg/dL   Calcium 8.7 (L) 8.9 - 10.3 mg/dL   GFR calc non Af Amer >60 >60 mL/min   GFR calc Af Amer >60 >60 mL/min   Anion gap 10 5 - 15  CBC  Result Value Ref Range   WBC 7.8 4.0 - 10.5 K/uL   RBC 3.46 (L) 3.87 - 5.11 MIL/uL   Hemoglobin 9.3 (L) 12.0 - 15.0 g/dL   HCT 30.0 (L) 36.0 - 46.0 %   MCV 86.7 80.0 - 100.0 fL   MCH 26.9 26.0 - 34.0 pg   MCHC 31.0 30.0 - 36.0 g/dL   RDW 16.9 (H) 11.5 - 15.5 %   Platelets 294 150 - 400 K/uL   nRBC 0.0 0.0 - 0.2 %  Urinalysis, Routine w reflex microscopic  Result Value Ref Range   Color, Urine YELLOW YELLOW   APPearance HAZY (A) CLEAR   Specific Gravity, Urine 1.009 1.005 - 1.030   pH 5.0 5.0 - 8.0   Glucose, UA NEGATIVE NEGATIVE mg/dL   Hgb urine dipstick SMALL (A) NEGATIVE    Bilirubin Urine NEGATIVE NEGATIVE   Ketones, ur NEGATIVE NEGATIVE mg/dL   Protein, ur NEGATIVE NEGATIVE mg/dL   Nitrite POSITIVE (A) NEGATIVE   Leukocytes,Ua TRACE (A) NEGATIVE   RBC / HPF 0-5 0 - 5 RBC/hpf   WBC, UA 6-10 0 - 5 WBC/hpf   Bacteria, UA MANY (A) NONE SEEN   Mucus PRESENT    Hyaline Casts, UA PRESENT   Vitamin B12  Result Value Ref Range   Vitamin B-12 323 180 - 914 pg/mL  Folate  Result Value Ref Range   Folate 13.9 >5.9 ng/mL  Iron and TIBC  Result Value Ref Range   Iron 20 (L) 28 - 170 ug/dL   TIBC 450 250 - 450 ug/dL   Saturation Ratios 4 (L) 10.4 - 31.8 %   UIBC 430 ug/dL  Ferritin  Result Value Ref Range   Ferritin 11 11 - 307 ng/mL  Reticulocytes  Result Value Ref Range   Retic Ct Pct 1.9 0.4 - 3.1 %   RBC. 3.41 (L) 3.87 - 5.11 MIL/uL   Retic Count, Absolute 63.4 19.0 - 186.0 K/uL   Immature Retic Fract 16.7 (H) 2.3 - 15.9 %  Magnesium  Result Value Ref Range   Magnesium 2.0 1.7 - 2.4 mg/dL  CBG monitoring, ED  Result Value Ref Range   Glucose-Capillary 169 (H) 70 - 99 mg/dL  POC occult blood, ED Provider will collect  Result Value Ref Range   Fecal Occult Bld NEGATIVE NEGATIVE    EKG EKG Interpretation  Date/Time:  Wednesday September 05 2018 11:17:37 EDT Ventricular Rate:  70 PR Interval:    QRS Duration: 112 QT Interval:  429 QTC Calculation: 463 R Axis:   63 Text Interpretation:  Accelerated junctional rhythm Borderline intraventricular conduction delay Borderline ST elevation, anterior leads Baseline wander in lead(s) III Interpretation limited secondary to artifact Confirmed by Fredia Sorrow [phone]) on 09/05/2018 11:24:03 AM   Radiology Ct Abdomen Pelvis Wo Contrast  Result Date: 09/05/2018 CLINICAL DATA:  Generalized abdominal pain, nausea, vomiting, diarrhea for several days. History of pancreatic cancer with Whipple in 2007 EXAM: CT ABDOMEN AND PELVIS WITHOUT CONTRAST TECHNIQUE: Multidetector CT imaging of the abdomen and pelvis was  performed following the standard protocol without IV contrast. COMPARISON:  None. FINDINGS: Lower chest: Basilar areas of atelectasis and/or scarring. Coronary calcifications. Calcifications of the aortic leaflet and mitral annulus. Cardiac size is normal. No pericardial effusion Hepatobiliary: Mild intra and extrahepatic biliary ductal dilatation postsurgical features of the Whipple procedure. Pneumobilia seen centrally within the left hepatic lobe. No concerning hepatic mass. Pancreas: Partial pancreatectomy changes from Whipple. Distal body and tail pancreatic atrophy. Difficult to assess the morphology of the surgical site in the absence of contrast medium. Spleen: Normal in size without focal abnormality. Adrenals/Urinary Tract: Adrenal glands are normal. Vascular calcium seen in the renal sinuses. Kidneys are otherwise, without renal calculi, suspicious lesion, or hydronephrosis. Bladder is largely decompressed at the time of exam and therefore poorly assessed by CT. Stomach/Bowel: Thickening of the distal esophagus. Stomach is distended with ingested material contrast medium. There is apparent thickening at the gastrojejunostomy site but this is poorly assessed given underdistention and lack of contrast. Passage of contrast media beyond this point. Adjacent inflammatory and phlegmonous features are present near the efferent limb. More distal small bowel is normal. The cecum is displaced into the right upper quadrant with the air-filled appendix along the under surface of the liver. Scattered colonic diverticula without focal pericolonic inflammation to suggest diverticulitis. Vascular/Lymphatic: Atherosclerotic plaque within the normal caliber aorta. Reactive lymph nodes present in the upper abdomen adjacent the inflammatory features near the gastrojejunostomy site. Reproductive: Uterus is surgically absent. No concerning adnexal lesions. Other: No abdominopelvic free fluid or free gas. No bowel containing  hernias. Intermediate attenuation thickening along the anterior abdominal wall anterior to the gastric antrum. Musculoskeletal: Multilevel degenerative changes are present in the imaged portions of the spine. Findings are maximal at the thoracolumbar junction. Additional degenerative changes in the SI joints and hips. No acute osseous abnormality or suspicious osseous lesion. IMPRESSION: 1. Partial pancreatectomy changes from Whipple procedure. There is nonspecific apparent thickening and phlegmonous change about the gastrojejunostomy site but this is poorly assessed given underdistention and lack of contrast medium. Differential would include inflammation or ulceration anastomotic site. No free air or abscess formation. No evidence of obstruction. 2. Intermediate attenuation thickening along the anterior abdominal wall anterior to the gastric antrum, favored to reflect surgical material. Correlate with surgical notes. 3. Suboptimal assessment of the surgical bed in the absence of contrast medium and given lack of comparison imaging. 4.  Aortic Atherosclerosis (ICD10-I70.0). These results were called by telephone at the time of interpretation on 09/05/2018 at 4:40 pm to Dr. Lajean Saver , who verbally acknowledged these results. Electronically Signed   By: Lovena Le M.D.   On: 09/05/2018 16:41    Procedures Procedures (including critical care time)  Medications Ordered in ED Medications  sodium chloride flush (NS) 0.9 % injection 3 mL (3 mLs Intravenous Given 09/05/18 1141)     Initial Impression / Assessment and Plan / ED Course  I have reviewed the triage vital signs  and the nursing notes.  Pertinent labs & imaging results that were available during my care of the patient were reviewed by me and considered in my medical decision making (see chart for details).  Iv ns bolus. Stat labs sent.   Reviewed nursing notes and prior charts for additional history.   Labs reviewed by me - hgb is lower  than previous, although no very recent results to compare. Stool test is heme negative.   CT reviewed by me - ?inflamm change at/around prior anastomotic site.   Protonix iv.  Given weakness, initial low bp with presyncope, recent black stools, ?marginal/anastamotic ulcer - will admit.  Hospitalists consulted for admission. Hospitalist requested that gen surgery be consulted - consulted gen surgery, discussed pt, reviewed CT, and she indicates no need for emergent gen surgery consulted, that pt can be admitted by hospitalist with gi consult, and added that hospitalist can call her directly if they want gen surgery consult.  Hospitalist re-contacted for admission.   Discussed gen surgery response with Dr Roel Cluck who will admit. She requests we consult gi for her - gi is consulted.   Dr Paulita Fujita, of [facility] called back, discussed pt, labs, ct - they will see in AM. Ivf, PPI.   Final Clinical Impressions(s) / ED Diagnoses   Final diagnoses:  None    ED Discharge Orders    None          Lajean Saver, MD 09/05/18 2031

## 2018-09-05 NOTE — ED Notes (Signed)
ED TO INPATIENT HANDOFF REPORT  ED Nurse Name and Phone #: jon wled   S Name/Age/Gender [name] 85 y.o. female Room/Bed: WA16/WA16  Code Status   Code Status: Not on file  Home/SNF/Other Home Patient oriented to: self, place, time and situation Is this baseline? Yes   Triage Complete: Triage complete  Chief Complaint Hypotension, Weakness, Dizziness  Triage Note Pt reports she has been having abdominal pain and her PCP prescribed Carafate. Pt reports she had some relief and then she started having the pain again and she had a near syncopal episode and has been having N/V/D.  Pt reports she feels like she is about to pass out now. Pt is pale and having dyspnea with any exertion.    Allergies Allergies  Allergen Reactions  . Ivp Dye [Iodinated Diagnostic Agents] Anaphylaxis    Pet scan Gastrografin per patient Irven Baltimore, RN  . Amoxicillin Nausea Only and Other (See Comments)    "Knocks her flat" per pt Did it involve swelling of the face/tongue/throat, SOB, or low BP? No Did it involve sudden or severe rash/hives, skin peeling, or any reaction on the inside of your mouth or nose? No Did you need to seek medical attention at a hospital or doctor's office? No When did it last happen?Over 10 years ago If all above answers are "NO", may proceed with cephalosporin use.  . Ciprofloxacin Nausea And Vomiting  . Dextromethorphan Polistirex Er Nausea And Vomiting  . Diatrizoate Meglumine & Sodium Other (See Comments)    Unknown rxn  . Flagyl [Metronidazole] Nausea And Vomiting  . Hydrocodone Nausea And Vomiting  . Methocarbamol Nausea And Vomiting  . Oxycodone Nausea And Vomiting  . Prednisone Nausea And Vomiting    Able to tolerate low doses  . Promethazine Hcl Nausea And Vomiting  . Sucralfate Diarrhea, Nausea Only and Other (See Comments)    Stomach pain and dizziness  . Tape Other (See Comments)    Skin tearing and irritation    Level of Care/Admitting  Diagnosis ED Disposition    ED Disposition Condition Comment   Admit  Hospital Area: [place] [[id number]]  Level of Care: Telemetry [5]  Admit to tele based on following criteria: Other see comments  Comments: presyncope  Covid Evaluation: Confirmed COVID Negative  Diagnosis: Abdominal pain [[id number]]  Admitting Physician: Toy Baker [[id number]]  Attending Physician: Toy Baker [[id number]]  PT Class (Do Not Modify): Observation [104]  PT Acc Code (Do Not Modify): Observation [10022]       B Medical/Surgery History Past Medical History:  Diagnosis Date  . Aortic valve sclerosis   . C. difficile colitis   . Hyperlipidemia   . Hypothyroid   . Mild recurrent major depression ([place])   . Osteopenia   . Pancreatic cancer [facility])    Pancreatic  . Vertigo   . Vitamin D deficiency    Past Surgical History:  Procedure Laterality Date  . BLADDER SUSPENSION  1999  . CATARACT EXTRACTION  2011   bilateral  . CHOLECYSTECTOMY  1992  . lower back surgery  1994, 1995   L4-5  . multiple hernias  2007, 2012   ventral  . PARTIAL HYSTERECTOMY  1973  . SHOULDER ARTHROSCOPY  2011   rt  . TONSILLECTOMY  1942  . WHIPPLE PROCEDURE  2006     A IV Location/Drains/Wounds Patient Lines/Drains/Airways Status   Active Line/Drains/Airways    Name:   Placement date:   Placement time:   Site:  Days:   Peripheral IV 09/05/18 Left Antecubital   09/05/18    1140    Antecubital   less than 1          Intake/Output Last 24 hours  Intake/Output Summary (Last 24 hours) at 09/05/2018 2147 Last data filed at 09/05/2018 1758 Gross per 24 hour  Intake 200 ml  Output -  Net 200 ml    Labs/Imaging Results for orders placed or performed during the hospital encounter of 09/05/18 (from the past 48 hour(s))  Basic metabolic panel     Status: Abnormal   Collection Time: 09/05/18 11:24 AM  Result Value Ref Range   Sodium 136 135 - 145 mmol/L   Potassium 2.9 (L) 3.5 - 5.1 mmol/L    Chloride 103 98 - 111 mmol/L   CO2 23 22 - 32 mmol/L   Glucose, Bld 178 (H) 70 - 99 mg/dL   BUN 23 8 - 23 mg/dL   Creatinine, Ser 0.87 0.44 - 1.00 mg/dL   Calcium 8.7 (L) 8.9 - 10.3 mg/dL   GFR calc non Af Amer >60 >60 mL/min   GFR calc Af Amer >60 >60 mL/min   Anion gap 10 5 - 15    Comment: Performed at [facility], [place] [address], [place], [place] [address]  CBC     Status: Abnormal   Collection Time: 09/05/18 11:24 AM  Result Value Ref Range   WBC 7.8 4.0 - 10.5 K/uL   RBC 3.46 (L) 3.87 - 5.11 MIL/uL   Hemoglobin 9.3 (L) 12.0 - 15.0 g/dL   HCT 30.0 (L) 36.0 - 46.0 %   MCV 86.7 80.0 - 100.0 fL   MCH 26.9 26.0 - 34.0 pg   MCHC 31.0 30.0 - 36.0 g/dL   RDW 16.9 (H) 11.5 - 15.5 %   Platelets 294 150 - 400 K/uL   nRBC 0.0 0.0 - 0.2 %    Comment: Performed at [facility], [place] [address], [place], [place] [address]  Reticulocytes     Status: Abnormal   Collection Time: 09/05/18 11:24 AM  Result Value Ref Range   Retic Ct Pct 1.9 0.4 - 3.1 %   RBC. 3.41 (L) 3.87 - 5.11 MIL/uL   Retic Count, Absolute 63.4 19.0 - 186.0 K/uL   Immature Retic Fract 16.7 (H) 2.3 - 15.9 %    Comment: Performed at [facility], [place] [address], [place], [place] [address]  Magnesium     Status: None   Collection Time: 09/05/18 11:24 AM  Result Value Ref Range   Magnesium 2.0 1.7 - 2.4 mg/dL    Comment: Performed at [facility], [place] [address], [place], [place] [address]  CBG monitoring, ED     Status: Abnormal   Collection Time: 09/05/18 11:34 AM  Result Value Ref Range   Glucose-Capillary 169 (H) 70 - 99 mg/dL  POC occult blood, ED Provider will collect     Status: None   Collection Time: 09/05/18 12:17 PM  Result Value Ref Range   Fecal Occult Bld NEGATIVE NEGATIVE  Vitamin B12     Status: None   Collection Time: 09/05/18  2:28 PM  Result Value Ref Range   Vitamin B-12 323 180 - 914 pg/mL    Comment: (NOTE) This  assay is not validated for testing neonatal or myeloproliferative syndrome specimens for Vitamin B12 levels. Performed at [facility], [place] [address], [place],  [address]   Folate  Status: None   Collection Time: 09/05/18  2:28 PM  Result Value Ref Range   Folate 13.9 >5.9 ng/mL    Comment: Performed at [facility], [place] [address] [place], [place] [address]  Iron and TIBC     Status: Abnormal   Collection Time: 09/05/18  2:28 PM  Result Value Ref Range   Iron 20 (L) 28 - 170 ug/dL   TIBC 450 250 - 450 ug/dL   Saturation Ratios 4 (L) 10.4 - 31.8 %   UIBC 430 ug/dL    Comment: Performed at [place], [place] [address], [place], [place] [address]  Ferritin     Status: None   Collection Time: 09/05/18  2:28 PM  Result Value Ref Range   Ferritin 11 11 - 307 ng/mL    Comment: Performed at [facility], [place] [address], [place], [place] [address]  Urinalysis, Routine w reflex microscopic     Status: Abnormal   Collection Time: 09/05/18  3:07 PM  Result Value Ref Range   Color, Urine YELLOW YELLOW   APPearance HAZY (A) CLEAR   Specific Gravity, Urine 1.009 1.005 - 1.030   pH 5.0 5.0 - 8.0   Glucose, UA NEGATIVE NEGATIVE mg/dL   Hgb urine dipstick SMALL (A) NEGATIVE   Bilirubin Urine NEGATIVE NEGATIVE   Ketones, ur NEGATIVE NEGATIVE mg/dL   Protein, ur NEGATIVE NEGATIVE mg/dL   Nitrite POSITIVE (A) NEGATIVE   Leukocytes,Ua TRACE (A) NEGATIVE   RBC / HPF 0-5 0 - 5 RBC/hpf   WBC, UA 6-10 0 - 5 WBC/hpf   Bacteria, UA MANY (A) NONE SEEN   Mucus PRESENT    Hyaline Casts, UA PRESENT     Comment: Performed at [facility], [place] [address], [place], [place] [address]  C difficile quick scan w PCR reflex     Status: None   Collection Time: 09/05/18  3:59 PM   Specimen: STOOL  Result Value Ref Range   C Diff antigen NEGATIVE NEGATIVE   C Diff toxin NEGATIVE NEGATIVE   C Diff  interpretation No C. difficile detected.     Comment: Performed at [facility], [place] [address] [place],  [address]  SARS Coronavirus 2 [facility] order, Performed in [facility] hospital lab) Nasopharyngeal Nasopharyngeal Swab     Status: None   Collection Time: 09/05/18  4:45 PM   Specimen: Nasopharyngeal Swab  Result Value Ref Range   SARS Coronavirus 2 NEGATIVE NEGATIVE    Comment: (NOTE) If result is NEGATIVE SARS-CoV-2 target nucleic acids are NOT DETECTED. The SARS-CoV-2 RNA is generally detectable in upper and lower  respiratory specimens during the acute phase of infection. The lowest  concentration of SARS-CoV-2 viral copies this assay can detect is 250  copies / mL. A negative result does not preclude SARS-CoV-2 infection  and should not be used as the sole basis for treatment or other  patient management decisions.  A negative result may occur with  improper specimen collection / handling, submission of specimen other  than nasopharyngeal swab, presence of viral mutation(s) within the  areas targeted by this assay, and inadequate number of viral copies  (<250 copies / mL). A negative result must be combined with clinical  observations, patient history, and epidemiological information. If result is POSITIVE SARS-CoV-2 target nucleic acids are DETECTED. The SARS-CoV-2 RNA is generally detectable in upper and lower  respiratory specimens dur ing the acute phase of infection.  Positive  results are indicative of active infection with SARS-CoV-2.  Clinical  correlation with patient history and other diagnostic information is  necessary to determine patient infection status.  Positive results do  not rule out bacterial infection or co-infection with other viruses. If result is PRESUMPTIVE POSTIVE SARS-CoV-2 nucleic acids MAY BE PRESENT.   A presumptive positive result was obtained on the submitted specimen  and confirmed on repeat testing.  While 2019  novel coronavirus  (SARS-CoV-2) nucleic acids may be present in the submitted sample  additional confirmatory testing may be necessary for epidemiological  and / or clinical management purposes  to differentiate between  SARS-CoV-2 and other Sarbecovirus currently known to infect humans.  If clinically indicated additional testing with an alternate test  methodology [id number]) is advised. The SARS-CoV-2 RNA is generally  detectable in upper and lower respiratory sp ecimens during the acute  phase of infection. The expected result is Negative. Fact Sheet for Patients:  StrictlyIdeas.no Fact Sheet for Healthcare Providers: BankingDealers.co.za This test is not yet approved or cleared by the [place] FDA and has been authorized for detection and/or diagnosis of SARS-CoV-2 by FDA under an Emergency Use Authorization (EUA).  This EUA will remain in effect (meaning this test can be used) for the duration of the COVID-19 declaration under Section 564(b)(1) of the Act, 21 U.S.C. section 360bbb-3(b)(1), unless the authorization is terminated or revoked sooner. Performed at [facility], [place] [address] [place], [place] [address]    Ct Abdomen Pelvis Wo Contrast  Result Date: 09/05/2018 CLINICAL DATA:  Generalized abdominal pain, nausea, vomiting, diarrhea for several days. History of pancreatic cancer with Whipple in 2007 EXAM: CT ABDOMEN AND PELVIS WITHOUT CONTRAST TECHNIQUE: Multidetector CT imaging of the abdomen and pelvis was performed following the standard protocol without IV contrast. COMPARISON:  None. FINDINGS: Lower chest: Basilar areas of atelectasis and/or scarring. Coronary calcifications. Calcifications of the aortic leaflet and mitral annulus. Cardiac size is normal. No pericardial effusion Hepatobiliary: Mild intra and extrahepatic biliary ductal dilatation postsurgical features of the Whipple procedure.  Pneumobilia seen centrally within the left hepatic lobe. No concerning hepatic mass. Pancreas: Partial pancreatectomy changes from Whipple. Distal body and tail pancreatic atrophy. Difficult to assess the morphology of the surgical site in the absence of contrast medium. Spleen: Normal in size without focal abnormality. Adrenals/Urinary Tract: Adrenal glands are normal. Vascular calcium seen in the renal sinuses. Kidneys are otherwise, without renal calculi, suspicious lesion, or hydronephrosis. Bladder is largely decompressed at the time of exam and therefore poorly assessed by CT. Stomach/Bowel: Thickening of the distal esophagus. Stomach is distended with ingested material contrast medium. There is apparent thickening at the gastrojejunostomy site but this is poorly assessed given underdistention and lack of contrast. Passage of contrast media beyond this point. Adjacent inflammatory and phlegmonous features are present near the efferent limb. More distal small bowel is normal. The cecum is displaced into the right upper quadrant with the air-filled appendix along the under surface of the liver. Scattered colonic diverticula without focal pericolonic inflammation to suggest diverticulitis. Vascular/Lymphatic: Atherosclerotic plaque within the normal caliber aorta. Reactive lymph nodes present in the upper abdomen adjacent the inflammatory features near the gastrojejunostomy site. Reproductive: Uterus is surgically absent. No concerning adnexal lesions. Other: No abdominopelvic free fluid or free gas. No bowel containing hernias. Intermediate attenuation thickening along the anterior abdominal wall anterior to the gastric antrum. Musculoskeletal: Multilevel degenerative changes are present in the imaged portions of the spine. Findings are  maximal at the thoracolumbar junction. Additional degenerative changes in the SI joints and hips. No acute osseous abnormality or suspicious osseous lesion. IMPRESSION: 1.  Partial pancreatectomy changes from Whipple procedure. There is nonspecific apparent thickening and phlegmonous change about the gastrojejunostomy site but this is poorly assessed given underdistention and lack of contrast medium. Differential would include inflammation or ulceration anastomotic site. No free air or abscess formation. No evidence of obstruction. 2. Intermediate attenuation thickening along the anterior abdominal wall anterior to the gastric antrum, favored to reflect surgical material. Correlate with surgical notes. 3. Suboptimal assessment of the surgical bed in the absence of contrast medium and given lack of comparison imaging. 4.  Aortic Atherosclerosis (ICD10-I70.0). These results were called by telephone at the time of interpretation on 09/05/2018 at 4:40 pm to Dr. Lajean Saver , who verbally acknowledged these results. Electronically Signed   By: Lovena Le M.D.   On: 09/05/2018 16:41    Pending Labs Unresulted Labs (From admission, onward)    Start     Ordered   09/05/18 1658  Urine Culture  Once,   STAT     09/05/18 1657   Signed and Held  Magnesium  Tomorrow morning,   R    Comments: Call MD if <1.5    Signed and Held   Signed and Held  Phosphorus  Tomorrow morning,   R     Signed and Held   Signed and Held  TSH  Once,   R    Comments: Cancel if already done within 1 month and notify MD    Signed and Held   Signed and Held  Comprehensive metabolic panel  Once,   R    Comments: Cal MD for K<3.5 or >5.0    Signed and Held   Signed and Held  CBC  Once,   R    Comments: Call for hg <8.0    Signed and Held          Vitals/Pain Today's Vitals   09/05/18 2000 09/05/18 2038 09/05/18 2130 09/05/18 2142  BP: (!) 127/54 (!) 143/54 (!) 140/53 140/61  Pulse: 62 65  65  Resp: 19 18 19 18   Temp:    97.6 F (36.4 C)  TempSrc:    Oral  SpO2: 97% 98%  99%  PainSc:    6     Isolation Precautions Enteric precautions (UV disinfection)  Medications Medications   sodium chloride flush (NS) 0.9 % injection 3 mL (3 mLs Intravenous Given 09/05/18 1141)  sodium chloride 0.9 % bolus 1,000 mL (0 mLs Intravenous Stopped 09/05/18 1942)  pantoprazole (PROTONIX) 80 mg in sodium chloride 0.9 % 100 mL IVPB (0 mg Intravenous Stopped 09/05/18 1758)  cefTRIAXone (ROCEPHIN) 1 g in sodium chloride 0.9 % 100 mL IVPB (0 g Intravenous Stopped 09/05/18 1758)  potassium chloride 10 mEq in 100 mL IVPB (0 mEq Intravenous Stopped 09/05/18 2142)    Mobility walks Low fall risk   Focused Assessments   R Recommendations: See Admitting Provider Note  Report given to:   Additional Notes:  Walks but to weak to walk lately

## 2018-09-06 ENCOUNTER — Observation Stay (HOSPITAL_COMMUNITY): Payer: Medicare Other | Admitting: Anesthesiology

## 2018-09-06 ENCOUNTER — Encounter (HOSPITAL_COMMUNITY): Payer: Self-pay

## 2018-09-06 ENCOUNTER — Ambulatory Visit (HOSPITAL_COMMUNITY): Admission: RE | Admit: 2018-09-06 | Payer: Medicare Other | Source: Ambulatory Visit

## 2018-09-06 ENCOUNTER — Encounter (HOSPITAL_COMMUNITY)
Admission: EM | Disposition: A | Discharge status: Home Health | Payer: Self-pay | Source: Home / Self Care | Attending: Family Medicine

## 2018-09-06 ENCOUNTER — Encounter (HOSPITAL_COMMUNITY): Payer: Self-pay | Admitting: *Deleted

## 2018-09-06 DIAGNOSIS — K259 Gastric ulcer, unspecified as acute or chronic, without hemorrhage or perforation: Secondary | ICD-10-CM | POA: Diagnosis present

## 2018-09-06 DIAGNOSIS — Z90411 Acquired partial absence of pancreas: Secondary | ICD-10-CM | POA: Diagnosis not present

## 2018-09-06 DIAGNOSIS — D649 Anemia, unspecified: Secondary | ICD-10-CM | POA: Diagnosis not present

## 2018-09-06 DIAGNOSIS — E039 Hypothyroidism, unspecified: Secondary | ICD-10-CM | POA: Diagnosis present

## 2018-09-06 DIAGNOSIS — I358 Other nonrheumatic aortic valve disorders: Secondary | ICD-10-CM | POA: Diagnosis present

## 2018-09-06 DIAGNOSIS — R1013 Epigastric pain: Secondary | ICD-10-CM | POA: Diagnosis not present

## 2018-09-06 DIAGNOSIS — M8580 Other specified disorders of bone density and structure, unspecified site: Secondary | ICD-10-CM | POA: Diagnosis present

## 2018-09-06 DIAGNOSIS — K289 Gastrojejunal ulcer, unspecified as acute or chronic, without hemorrhage or perforation: Secondary | ICD-10-CM | POA: Diagnosis not present

## 2018-09-06 DIAGNOSIS — Z8507 Personal history of malignant neoplasm of pancreas: Secondary | ICD-10-CM | POA: Diagnosis not present

## 2018-09-06 DIAGNOSIS — R8281 Pyuria: Secondary | ICD-10-CM | POA: Diagnosis present

## 2018-09-06 DIAGNOSIS — E876 Hypokalemia: Secondary | ICD-10-CM | POA: Diagnosis present

## 2018-09-06 DIAGNOSIS — R8271 Bacteriuria: Secondary | ICD-10-CM | POA: Diagnosis present

## 2018-09-06 DIAGNOSIS — Z8249 Family history of ischemic heart disease and other diseases of the circulatory system: Secondary | ICD-10-CM | POA: Diagnosis not present

## 2018-09-06 DIAGNOSIS — Z91041 Radiographic dye allergy status: Secondary | ICD-10-CM | POA: Diagnosis not present

## 2018-09-06 DIAGNOSIS — I959 Hypotension, unspecified: Secondary | ICD-10-CM | POA: Diagnosis present

## 2018-09-06 DIAGNOSIS — K253 Acute gastric ulcer without hemorrhage or perforation: Secondary | ICD-10-CM

## 2018-09-06 DIAGNOSIS — I739 Peripheral vascular disease, unspecified: Secondary | ICD-10-CM | POA: Diagnosis present

## 2018-09-06 DIAGNOSIS — I10 Essential (primary) hypertension: Secondary | ICD-10-CM | POA: Diagnosis not present

## 2018-09-06 DIAGNOSIS — Z79899 Other long term (current) drug therapy: Secondary | ICD-10-CM | POA: Diagnosis not present

## 2018-09-06 DIAGNOSIS — F330 Major depressive disorder, recurrent, mild: Secondary | ICD-10-CM | POA: Diagnosis present

## 2018-09-06 DIAGNOSIS — R1084 Generalized abdominal pain: Secondary | ICD-10-CM | POA: Diagnosis present

## 2018-09-06 DIAGNOSIS — K920 Hematemesis: Secondary | ICD-10-CM | POA: Diagnosis present

## 2018-09-06 DIAGNOSIS — D500 Iron deficiency anemia secondary to blood loss (chronic): Secondary | ICD-10-CM | POA: Diagnosis present

## 2018-09-06 DIAGNOSIS — K8681 Exocrine pancreatic insufficiency: Secondary | ICD-10-CM | POA: Diagnosis present

## 2018-09-06 DIAGNOSIS — K921 Melena: Secondary | ICD-10-CM | POA: Diagnosis not present

## 2018-09-06 DIAGNOSIS — E559 Vitamin D deficiency, unspecified: Secondary | ICD-10-CM | POA: Diagnosis present

## 2018-09-06 DIAGNOSIS — Z20828 Contact with and (suspected) exposure to other viral communicable diseases: Secondary | ICD-10-CM | POA: Diagnosis present

## 2018-09-06 DIAGNOSIS — Z66 Do not resuscitate: Secondary | ICD-10-CM | POA: Diagnosis present

## 2018-09-06 DIAGNOSIS — E785 Hyperlipidemia, unspecified: Secondary | ICD-10-CM | POA: Diagnosis present

## 2018-09-06 DIAGNOSIS — R55 Syncope and collapse: Secondary | ICD-10-CM | POA: Diagnosis present

## 2018-09-06 HISTORY — PX: ESOPHAGOGASTRODUODENOSCOPY (EGD) WITH PROPOFOL: SHX5813

## 2018-09-06 LAB — CBC
HCT: 26.2 % — ABNORMAL LOW (ref 36.0–46.0)
HCT: 26.4 % — ABNORMAL LOW (ref 36.0–46.0)
HCT: 28.1 % — ABNORMAL LOW (ref 36.0–46.0)
Hemoglobin: 7.8 g/dL — ABNORMAL LOW (ref 12.0–15.0)
Hemoglobin: 8.1 g/dL — ABNORMAL LOW (ref 12.0–15.0)
Hemoglobin: 8.6 g/dL — ABNORMAL LOW (ref 12.0–15.0)
MCH: 26.4 pg (ref 26.0–34.0)
MCH: 27.1 pg (ref 26.0–34.0)
MCH: 27 pg (ref 26.0–34.0)
MCHC: 29.8 g/dL — ABNORMAL LOW (ref 30.0–36.0)
MCHC: 30.7 g/dL (ref 30.0–36.0)
MCHC: 30.6 g/dL (ref 30.0–36.0)
MCV: 88.8 fL (ref 80.0–100.0)
MCV: 88.3 fL (ref 80.0–100.0)
MCV: 88.4 fL (ref 80.0–100.0)
Platelets: 249 10*3/uL (ref 150–400)
Platelets: 261 10*3/uL (ref 150–400)
Platelets: 254 10*3/uL (ref 150–400)
RBC: 2.95 MIL/uL — ABNORMAL LOW (ref 3.87–5.11)
RBC: 2.99 MIL/uL — ABNORMAL LOW (ref 3.87–5.11)
RBC: 3.18 MIL/uL — ABNORMAL LOW (ref 3.87–5.11)
RDW: 17.2 % — ABNORMAL HIGH (ref 11.5–15.5)
RDW: 17.1 % — ABNORMAL HIGH (ref 11.5–15.5)
RDW: 17.2 % — ABNORMAL HIGH (ref 11.5–15.5)
WBC: 5.9 10*3/uL (ref 4.0–10.5)
WBC: 5.5 10*3/uL (ref 4.0–10.5)
WBC: 5.9 10*3/uL (ref 4.0–10.5)
nRBC: 0 % (ref 0.0–0.2)
nRBC: 0 % (ref 0.0–0.2)
nRBC: 0 % (ref 0.0–0.2)

## 2018-09-06 LAB — COMPREHENSIVE METABOLIC PANEL
ALT: 14 U/L (ref 0–44)
AST: 17 U/L (ref 15–41)
Albumin: 2.7 g/dL — ABNORMAL LOW (ref 3.5–5.0)
Alkaline Phosphatase: 48 U/L (ref 38–126)
Anion gap: 6 (ref 5–15)
BUN: 11 mg/dL (ref 8–23)
CO2: 18 mmol/L — ABNORMAL LOW (ref 22–32)
Calcium: 8 mg/dL — ABNORMAL LOW (ref 8.9–10.3)
Chloride: 113 mmol/L — ABNORMAL HIGH (ref 98–111)
Creatinine, Ser: 0.63 mg/dL (ref 0.44–1.00)
GFR calc Af Amer: >60 mL/min (ref >60)
GFR calc non Af Amer: >60 mL/min (ref >60)
Glucose, Bld: 127 mg/dL — ABNORMAL HIGH (ref 70–99)
Potassium: 4.3 mmol/L (ref 3.5–5.1)
Sodium: 137 mmol/L (ref 135–145)
Total Bilirubin: 0.4 mg/dL (ref 0.3–1.2)
Total Protein: 5.2 g/dL — ABNORMAL LOW (ref 6.5–8.1)

## 2018-09-06 LAB — PHOSPHORUS: Phosphorus: 2.7 mg/dL (ref 2.5–4.6)

## 2018-09-06 LAB — MAGNESIUM: Magnesium: 1.8 mg/dL (ref 1.7–2.4)

## 2018-09-06 LAB — TSH: TSH: 2.463 u[IU]/mL (ref 0.350–4.500)

## 2018-09-06 SURGERY — ESOPHAGOGASTRODUODENOSCOPY (EGD) WITH PROPOFOL
Anesthesia: Monitor Anesthesia Care

## 2018-09-06 MED ORDER — SODIUM CHLORIDE 0.9 % IV SOLN: INTRAVENOUS | Status: DC

## 2018-09-06 MED ORDER — LACTATED RINGERS IV SOLN
INTRAVENOUS | Status: DC | PRN
  Administered 2018-09-06: 15:00:00 via INTRAVENOUS

## 2018-09-06 MED ORDER — PROPOFOL 10 MG/ML IV BOLUS
INTRAVENOUS | Status: DC | PRN
  Administered 2018-09-06: 10 mg via INTRAVENOUS
  Administered 2018-09-06: 20 mg via INTRAVENOUS
  Administered 2018-09-06: 10 mg via INTRAVENOUS

## 2018-09-06 MED ORDER — PROPOFOL 10 MG/ML IV BOLUS
INTRAVENOUS | Status: AC
  Filled 2018-09-06: qty 40

## 2018-09-06 MED ORDER — PROPOFOL 500 MG/50ML IV EMUL
INTRAVENOUS | Status: DC | PRN
  Administered 2018-09-06: 135 ug/kg/min via INTRAVENOUS

## 2018-09-06 SURGICAL SUPPLY — 15 items

## 2018-09-06 NOTE — Anesthesia Postprocedure Evaluation (Signed)
Anesthesia Post Note  Patient: [name]  Procedure(s) Performed: ESOPHAGOGASTRODUODENOSCOPY (EGD) WITH PROPOFOL (N/A )     Patient location during evaluation: Endoscopy Anesthesia Type: MAC Level of consciousness: awake and alert Pain management: pain level controlled Vital Signs Assessment: post-procedure vital signs reviewed and stable Respiratory status: spontaneous breathing, nonlabored ventilation, respiratory function stable and patient connected to nasal cannula oxygen Cardiovascular status: blood pressure returned to baseline and stable Postop Assessment: no apparent nausea or vomiting Anesthetic complications: no    Last Vitals:  Vitals:   09/06/18 1520 09/06/18 1552  BP: (!) 139/46 (!) 145/56  Pulse:  (!) 120  Resp:    Temp:  37 C  SpO2:  95%    Last Pain:  Vitals:   09/06/18 1552  TempSrc: Oral  PainSc:                  Silveria Botz DANIEL

## 2018-09-06 NOTE — Op Note (Signed)
[facility] Patient Name: [name] Procedure Date: 09/06/2018 MRN: [id number] Attending MD: Wonda Horner , MD Date of Birth: 07-11-1932 CSN: [id number] Age: 85 Admit Type: Inpatient Procedure:                Upper GI endoscopy Indications:              Coffee-ground emesis, Melena Providers:                Wonda Horner, MD, Cleda Daub, RN, Ladona Ridgel, Technician Referring MD:              Medicines:                Propofol per Anesthesia Complications:            No immediate complications. Estimated Blood Loss:     Estimated blood loss: none. Procedure:                Pre-Anesthesia Assessment:                           - Prior to the procedure, a History and Physical                            was performed, and patient medications and                            allergies were reviewed. The patient's tolerance of                            previous anesthesia was also reviewed. The risks                            and benefits of the procedure and the sedation                            options and risks were discussed with the patient.                            All questions were answered, and informed consent                            was obtained. Prior Anticoagulants: The patient has                            taken no previous anticoagulant or antiplatelet                            agents. ASA Grade Assessment: II - A patient with                            mild systemic disease. After reviewing the risks  and benefits, the patient was deemed in                            satisfactory condition to undergo the procedure.                           After obtaining informed consent, the endoscope was                            passed under direct vision. Throughout the                            procedure, the patient's blood pressure, pulse, and                            oxygen saturations were  monitored continuously. The                            GIF-H190 ([id number]) Olympus gastroscope was                            introduced through the mouth, and advanced to the                            operative stoma of duodenum. The upper GI endoscopy                            was accomplished without difficulty. The patient                            tolerated the procedure well. Scope In: Scope Out: Findings:      The examined esophagus was normal.      One cratered gastric ulcer with no stigmata of bleeding was found at the       anastomosis. The lesion was 10 mm in largest dimension.      Could not advance past the anastamosis due to anatomy Impression:               - Normal esophagus.                           - Gastric ulcer with no stigmata of bleeding.                           - No specimens collected. Moderate Sedation:      . Recommendation:           - Advance diet as tolerated.                           - Continue present medications.                           - Avoid NSAIDS                           - ppi THERAPY. Procedure Code(s):        ---  Professional ---                           [phone], Esophagogastroduodenoscopy, flexible,                            transoral; diagnostic, including collection of                            specimen(s) by brushing or washing, when performed                            (separate procedure) Diagnosis Code(s):        --- Professional ---                           K25.9, Gastric ulcer, unspecified as acute or                            chronic, without hemorrhage or perforation                           K92.0, Hematemesis                           K92.1, Melena (includes Hematochezia) CPT copyright 2019 [employer] Association. All rights reserved. The codes documented in this report are preliminary and upon coder review may  be revised to meet current compliance requirements. Wonda Horner, MD 09/06/2018 3:08:50 PM This  report has been signed electronically. Number of Addenda: 0

## 2018-09-06 NOTE — H&P (Signed)
Patient in endo for egd. No change in hx and PE since seen a few hours ago

## 2018-09-06 NOTE — Progress Notes (Signed)
OT Cancellation Note  Patient Details Name: Vineta Carone MRN: [id number] DOB: 1932/03/20   Cancelled Treatment:      Pt in in endoscopy. Will check back later or tomorrow. Pavielle Biggar 09/06/2018, 1:22 PM  Lesle Chris, OTR/L Acute Rehabilitation Services [phone] [facility] pager [phone] office 09/06/2018

## 2018-09-06 NOTE — Anesthesia Procedure Notes (Signed)
Procedure Name: MAC Date/Time: 09/06/2018 2:42 PM Performed by: Niel Hummer, CRNA Pre-anesthesia Checklist: Patient identified, Emergency Drugs available, Suction available and Patient being monitored Patient Re-evaluated:Patient Re-evaluated prior to induction Oxygen Delivery Method: Nasal cannula

## 2018-09-06 NOTE — Evaluation (Signed)
Physical Therapy One Time Evaluation Patient Details Name: [name] MRN: [id number] DOB: [birth year] Today's Date: 09/06/2018   History of Present Illness  85 y.o. female with medical history significant of pancreatic cancer status post Whipple procedure in 2006 with recurrence in treatment in 2008 now in remission, history of C. difficile, HLD, hypothyroidism, vertigo, peripheral vascular disease and admitted for abnormal CT with possible underlining peptic ulcer disease  Clinical Impression  Patient evaluated by Physical Therapy with no further acute PT needs identified. All education has been completed and the patient has no further questions.  Pt ambulated in hallway around unit without any difficulty and denies any symptoms.  Pt reports just starting HHPT for chronic back pain prior to admission. Recommend pt ambulate with nursing staff during acute stay (RN aware). See below for any follow-up Physical Therapy or equipment needs. PT is signing off. Thank you for this referral.     Follow Up Recommendations Home health PT(just started HHPT for back pain prior to admission)    Equipment Recommendations  None recommended by PT    Recommendations for Other Services       Precautions / Restrictions Precautions Precautions: None      Mobility  Bed Mobility Overal bed mobility: Needs Assistance Bed Mobility: Supine to Sit     Supine to sit: Supervision     General bed mobility comments: slow however no physical assist required  Transfers Overall transfer level: Needs assistance Equipment used: None Transfers: Sit to/from Stand Sit to Stand: Min guard         General transfer comment: min/guard for safety  Ambulation/Gait Ambulation/Gait assistance: Min guard;Supervision Gait Distance (Feet): 400 Feet Assistive device: None Gait Pattern/deviations: WFL(Within Functional Limits)     General Gait Details: pt ambulated in hallway initially pushing IV pole  however then not using UEs, pt steady and denies any dizziness  Stairs            Wheelchair Mobility    Modified Rankin (Stroke Patients Only)       Balance Overall balance assessment: No apparent balance deficits (not formally assessed)(denies any recent falls)                                           Pertinent Vitals/Pain Pain Assessment: Faces Faces Pain Scale: Hurts a little bit Pain Location: chronic back pain Pain Descriptors / Indicators: Sore Pain Intervention(s): Monitored during session;Repositioned    Home Living Family/patient expects to be discharged to:: Private residence Living Arrangements: Spouse/significant other   Type of Home: House       Home Layout: Two level Home Equipment: Environmental consultant - 2 wheels      Prior Function Level of Independence: Independent         Comments: 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     Hand Dominance        Extremity/Trunk Assessment        Lower Extremity Assessment Lower Extremity Assessment: Overall WFL for tasks assessed       Communication   Communication: No difficulties  Cognition Arousal/Alertness: Awake/alert Behavior During Therapy: WFL for tasks assessed/performed Overall Cognitive Status: Within Functional Limits for tasks assessed  General Comments      Exercises     Assessment/Plan    PT Assessment All further PT needs can be met in the next venue of care  PT Problem List Pain(chronic back pain from DJD per pt)       PT Treatment Interventions      PT Goals (Current goals can be found in the Care Plan section)  Acute Rehab PT Goals PT Goal Formulation: All assessment and education complete, DC therapy    Frequency     Barriers to discharge        Co-evaluation               AM-PAC PT "6 Clicks" Mobility  Outcome Measure Help needed turning from your back to your side while in a  flat bed without using bedrails?: None Help needed moving from lying on your back to sitting on the side of a flat bed without using bedrails?: A Little Help needed moving to and from a bed to a chair (including a wheelchair)?: A Little Help needed standing up from a chair using your arms (e.g., wheelchair or bedside chair)?: A Little Help needed to walk in hospital room?: A Little Help needed climbing 3-5 steps with a railing? : A Little 6 Click Score: 19    End of Session Equipment Utilized During Treatment: Gait belt Activity Tolerance: Patient tolerated treatment well Patient left: with call bell/phone within reach;in chair Nurse Communication: Mobility status PT Visit Diagnosis: Difficulty in walking, not elsewhere classified (R26.2)    Time: 1049-1101 PT Time Calculation (min) (ACUTE ONLY): 12 min   Charges:   PT Evaluation $PT Eval Low Complexity: Ada, PT, DPT Acute Rehabilitation Services Office: [phone] Pager: [phone]   Trena Platt 09/06/2018, 11:36 AM

## 2018-09-06 NOTE — Progress Notes (Signed)
Initial Nutrition Assessment  RD working remotely.   DOCUMENTATION CODES:   (unable to assess for malnutrition at this time.)  INTERVENTION:  - will monitor for diet advancement and provide interventions at that time   NUTRITION DIAGNOSIS:   Inadequate oral intake related to inability to eat as evidenced by NPO status.  GOAL:   Patient will meet greater than or equal to 90% of their needs  MONITOR:   Diet advancement, PO intake, Labs, Weight trends  REASON FOR ASSESSMENT:   Malnutrition Screening Tool  ASSESSMENT:   85 y.o. female with medical history significant of pancreatic cancer s/p Whipple procedure in 2006 with recurrence in treatment in 2008 now in remission, history of C. difficile, HLD, hypothyroidism, vertigo, and PVD. She presented to the ED with persistent, non-radiating epigastric pain and feeling lightheaded when trying to stand up on the day of presentation. She began having epigastric pain a few weeks ago and GI MD ordered xray and started her on Carafate. Home PT arrived to patient's home on the day of presentation and found her BP to be 60/40.  Patient was placed on a CLD yesterday at 9:58 PM and then made NPO at midnight. She is currently out of her room to endoscopy. Per chart review, current weight is 113 lb and weight on 5/26 was 119 lb. This indicates 6 lb weight loss (5% body weight) which is not significant for 2.5 month time frame but unsure if weight loss has occurred more acutely.  Per EGD procedure note, patient was found to have normal esophagus and one cratered gastric ulcer with no sign of bleeding.    Labs reviewed; Cl: 113 mmol/l, Ca: 8 mg/dl. Medications reviewed; 10 mEq IV KCl x4 runs 8/5.     NUTRITION - FOCUSED PHYSICAL EXAM:  unable to complete at this time  Diet Order:   Diet Order            Diet NPO time specified  Diet effective now              EDUCATION NEEDS:   No education needs have been identified at this  time  Skin:  Skin Assessment: Reviewed RN Assessment  Last BM:  8/5  Height:   Ht Readings from Last 1 Encounters:  09/06/18 5\' 2"  (1.575 m)    Weight:   Wt Readings from Last 1 Encounters:  09/06/18 51.2 kg    Ideal Body Weight:  50 kg  BMI:  Body mass index is 20.65 kg/m.  Estimated Nutritional Needs:   Kcal:  1540-1790 kcal  Protein:  60-70 grams  Fluid:  >/= 1.8 L/day     Jarome Matin, MS, RD, LDN, [facility] Inpatient Clinical Dietitian Pager # [phone] After hours/weekend pager # [phone]

## 2018-09-06 NOTE — Progress Notes (Signed)
PIV consult: Arrived to room, pt has already been taken to endoscopy.

## 2018-09-06 NOTE — Consult Note (Signed)
Subjective:   HPI  The patient is an 85 year old female with a history of pancreatic cancer in the past status post Whipple procedure in 2006.  Over the past few weeks she has been experiencing epigastric abdominal pain.  She was started on Carafate for this and last week about 7 days ago started having vomiting which was coffee-ground in appearance.  She then also started to have melena.  Yesterday her therapist came by to her home and noted that her blood pressure was 60/40 and she was told to go to the emergency room.  She had been feeling a little dizzy also.  A CT scan was done showing nonspecific thickening and phlegmonous change about the gastro jejunostomy site.  The patient reports that about a month ago she started taking 2 Aleve every morning for some neck related pain.  Review of Systems No chest pain or shortness of breath  Past Medical History:  Diagnosis Date  . Aortic valve sclerosis   . C. difficile colitis   . Hyperlipidemia   . Hypothyroid   . Mild recurrent major depression ([place])   . Osteopenia   . Pancreatic cancer [facility])    Pancreatic  . Vertigo   . Vitamin D deficiency    Past Surgical History:  Procedure Laterality Date  . BLADDER SUSPENSION  1999  . CATARACT EXTRACTION  2011   bilateral  . CHOLECYSTECTOMY  1992  . lower back surgery  1994, 1995   L4-5  . multiple hernias  2007, 2012   ventral  . PARTIAL HYSTERECTOMY  1973  . SHOULDER ARTHROSCOPY  2011   rt  . TONSILLECTOMY  1942  . WHIPPLE PROCEDURE  2006   Social History   Socioeconomic History  . Marital status: Married    Spouse name: Not on file  . Number of children: Not on file  . Years of education: Not on file  . Highest education level: Not on file  Occupational History  . Not on file  Social Needs  . Financial resource strain: Not on file  . Food insecurity    Worry: Not on file    Inability: Not on file  . Transportation needs    Medical: Not on file    Non-medical: Not on file   Tobacco Use  . Smoking status: Never Smoker  . Smokeless tobacco: Never Used  Substance and Sexual Activity  . Alcohol use: Yes  . Drug use: Never  . Sexual activity: Not on file  Lifestyle  . Physical activity    Days per week: Not on file    Minutes per session: Not on file  . Stress: Not on file  Relationships  . Social Herbalist on phone: Not on file    Gets together: Not on file    Attends religious service: Not on file    Active member of club or organization: Not on file    Attends meetings of clubs or organizations: Not on file    Relationship status: Not on file  . Intimate partner violence    Fear of current or ex partner: Not on file    Emotionally abused: Not on file    Physically abused: Not on file    Forced sexual activity: Not on file  Other Topics Concern  . Not on file  Social History Narrative  . Not on file   family history includes Breast cancer in her sister; Cancer in her maternal grandfather and sister; Heart attack  in her father; Heart disease in her maternal grandmother and mother; Heart failure in her mother; Hypertension in her mother, sister, and sister.  Current Facility-Administered Medications:  .  acetaminophen (TYLENOL) tablet 650 mg, 650 mg, Oral, Q6H PRN **OR** acetaminophen (TYLENOL) suppository 650 mg, 650 mg, Rectal, Q6H PRN, Doutova, Anastassia, MD .  cefTRIAXone (ROCEPHIN) 1 g in sodium chloride 0.9 % 100 mL IVPB, 1 g, Intravenous, Q24H, Doutova, Anastassia, MD .  ondansetron (ZOFRAN) tablet 4 mg, 4 mg, Oral, Q6H PRN **OR** ondansetron (ZOFRAN) injection 4 mg, 4 mg, Intravenous, Q6H PRN, Doutova, Anastassia, MD .  pantoprazole (PROTONIX) injection 40 mg, 40 mg, Intravenous, Q12H, Doutova, Anastassia, MD, 40 mg at 09/05/18 2332 .  sertraline (ZOLOFT) tablet 25 mg, 25 mg, Oral, QHS, Doutova, Anastassia, MD, 25 mg at 09/05/18 2236 Allergies  Allergen Reactions  . Ivp Dye [Iodinated Diagnostic Agents] Anaphylaxis    Pet scan  Gastrografin per patient Irven Baltimore, RN  . Amoxicillin Nausea Only and Other (See Comments)    "Knocks her flat" per pt Did it involve swelling of the face/tongue/throat, SOB, or low BP? No Did it involve sudden or severe rash/hives, skin peeling, or any reaction on the inside of your mouth or nose? No Did you need to seek medical attention at a hospital or doctor's office? No When did it last happen?Over 10 years ago If all above answers are "NO", may proceed with cephalosporin use.  . Ciprofloxacin Nausea And Vomiting  . Dextromethorphan Polistirex Er Nausea And Vomiting  . Diatrizoate Meglumine & Sodium Other (See Comments)    Unknown rxn  . Flagyl [Metronidazole] Nausea And Vomiting  . Hydrocodone Nausea And Vomiting  . Methocarbamol Nausea And Vomiting  . Oxycodone Nausea And Vomiting  . Prednisone Nausea And Vomiting    Able to tolerate low doses  . Promethazine Hcl Nausea And Vomiting  . Sucralfate Diarrhea, Nausea Only and Other (See Comments)    Stomach pain and dizziness  . Tape Other (See Comments)    Skin tearing and irritation     Objective:     BP (!) 128/48 (BP Location: Right Arm)   Pulse 63   Temp 99 F (37.2 C) (Oral)   Resp 18   Ht 5\' 2"  (1.575 m)   Wt 51.2 kg   SpO2 99%   BMI 20.65 kg/m   No acute distress  Heart regular rhythm no murmurs  Lungs clear  Abdomen soft and nontender  Laboratory No components found for: D1    Assessment:     Suspect inflammation or ulceration at the gastrojejunostomy site      Plan:     EGD today Lab Results  Component Value Date   HGB 8.1 (L) 09/06/2018   HGB 7.8 (L) 09/06/2018   HGB 9.3 (L) 09/05/2018   HCT 26.4 (L) 09/06/2018   HCT 26.2 (L) 09/06/2018   HCT 30.0 (L) 09/05/2018   ALKPHOS 48 09/06/2018   ALKPHOS 73 05/27/2011   ALKPHOS 83 10/21/2010   AST 17 09/06/2018   AST 28 05/27/2011   AST 22 10/21/2010   ALT 14 09/06/2018   ALT 21 05/27/2011   ALT 14 10/21/2010

## 2018-09-06 NOTE — Anesthesia Preprocedure Evaluation (Signed)
Anesthesia Evaluation  Patient identified by MRN, date of birth, ID band Patient awake    Reviewed: Allergy & Precautions, NPO status , Patient's Chart, lab work & pertinent test results  History of Anesthesia Complications Negative for: history of anesthetic complications  Airway Mallampati: II  TM Distance: >3 FB Neck ROM: Full    Dental no notable dental hx. (+) Dental Advisory Given   Pulmonary neg pulmonary ROS,    Pulmonary exam normal        Cardiovascular hypertension, Normal cardiovascular exam     Neuro/Psych PSYCHIATRIC DISORDERS Depression negative neurological ROS     GI/Hepatic negative GI ROS, Neg liver ROS, Hx of Pancreatic ca s/p whipple   Endo/Other  Hypothyroidism   Renal/GU negative Renal ROS  negative genitourinary   Musculoskeletal negative musculoskeletal ROS (+)   Abdominal   Peds negative pediatric ROS (+)  Hematology negative hematology ROS (+) anemia ,   Anesthesia Other Findings   Reproductive/Obstetrics negative OB ROS                             Anesthesia Physical Anesthesia Plan  ASA: III  Anesthesia Plan: MAC   Post-op Pain Management:    Induction:   PONV Risk Score and Plan: 2 and Ondansetron and Propofol infusion  Airway Management Planned: Natural Airway  Additional Equipment:   Intra-op Plan:   Post-operative Plan:   Informed Consent: I have reviewed the patients History and Physical, chart, labs and discussed the procedure including the risks, benefits and alternatives for the proposed anesthesia with the patient or authorized representative who has indicated his/her understanding and acceptance.     Dental advisory given  Plan Discussed with: Anesthesiologist  Anesthesia Plan Comments:         Anesthesia Quick Evaluation

## 2018-09-06 NOTE — Progress Notes (Signed)
  PROGRESS NOTE  [name] ZDG:[id number] DOB: 1932/11/26 DOA: 09/05/2018 PCP: Lajean Manes, MD   A & P  Gastric ulcer without stigmata of bleeding --Continue PPI, diet per GI  Abdominal pain --Resolved  Normocytic anemia --Stable.  Follow-up as an outpatient.  Asymptomatic bacteriuria --Completely asymptomatic.  Stop antibiotics.   Overall improved.  Continue management as above.  Possible discharge 8/7.  DVT prophylaxis: SCDs Code Status: DNR Family Communication: none Disposition Plan: home    Murray Hodgkins, MD  [facility] Direct contact: see [email or web address] (further directions at bottom of note if needed) 7PM-7AM contact night coverage as at bottom of note 09/06/2018, 9:44 PM  LOS: 0 days   Consultants  . GI   Interval History/Subjective  Feels wonderful.  No complaints.  Tolerating diet.  Objective   Vitals:  Vitals:   09/06/18 1552 09/06/18 2109  BP: (!) 145/56 (!) 129/45  Pulse: (!) 120 67  Resp:  16  Temp: 98.6 F (37 C) 98.4 F (36.9 C)  SpO2: 95% 100%    Exam:  Constitutional:  . Appears calm and comfortable Respiratory:  . CTA bilaterally, no w/r/r.  . Respiratory effort normal.  Cardiovascular:  . RRR, no m/r/g . No LE extremity edema   Abdomen:  . Soft, nontender, nondistended Psychiatric:  . Mental status o Mood, affect appropriate  I have personally reviewed the following:   Today's Data  . Hemoglobin stable 8.6.  Scheduled Meds: . pantoprazole (PROTONIX) IV  40 mg Intravenous Q12H  . sertraline  25 mg Oral QHS   Continuous Infusions:   Principal Problem:   Gastric ulcer Active Problems:   Hypokalemia   HTN (hypertension)   Abnormal CT of the abdomen   Anemia   LOS: 0 days   How to contact the [facility] Attending or Consulting provider [place] or covering provider during after hours [place], for this patient?  1. Check the care team in [facility] and look for a) attending/consulting [facility] provider listed and b) the  [facility] team listed 2. Log into [email or web address] and use [place]'s universal password to access. If you do not have the password, please contact the hospital operator. 3. Locate the [facility] provider you are looking for under [facility] and page to a number that you can be directly reached. 4. If you still have difficulty reaching the provider, please page the [facility] (Director on Call) for the Hospitalists listed on amion for assistance.

## 2018-09-06 NOTE — Transfer of Care (Signed)
Immediate Anesthesia Transfer of Care Note  Patient: [name]  Procedure(s) Performed: ESOPHAGOGASTRODUODENOSCOPY (EGD) WITH PROPOFOL (N/A )  Patient Location: PACU  Anesthesia Type:MAC  Level of Consciousness: awake  Airway & Oxygen Therapy: Patient Spontanous Breathing and Patient connected to face mask oxygen  Post-op Assessment: Report given to RN and Post -op Vital signs reviewed and stable  Post vital signs: Reviewed and stable  Last Vitals:  Vitals Value Taken Time  BP    Temp    Pulse    Resp    SpO2      Last Pain:  Vitals:   09/06/18 1315  TempSrc: Oral  PainSc:       Patients Stated Pain Goal: 2 (33/82/50 5397)  Complications: No apparent anesthesia complications

## 2018-09-07 LAB — CBC
HCT: 26.9 % — ABNORMAL LOW (ref 36.0–46.0)
Hemoglobin: 8.5 g/dL — ABNORMAL LOW (ref 12.0–15.0)
MCH: 27.5 pg (ref 26.0–34.0)
MCHC: 31.6 g/dL (ref 30.0–36.0)
MCV: 87.1 fL (ref 80.0–100.0)
Platelets: 249 10*3/uL (ref 150–400)
RBC: 3.09 MIL/uL — ABNORMAL LOW (ref 3.87–5.11)
RDW: 17 % — ABNORMAL HIGH (ref 11.5–15.5)
WBC: 6 10*3/uL (ref 4.0–10.5)
nRBC: 0 % (ref 0.0–0.2)

## 2018-09-07 LAB — BASIC METABOLIC PANEL
Anion gap: 5 (ref 5–15)
BUN: 9 mg/dL (ref 8–23)
CO2: 24 mmol/L (ref 22–32)
Calcium: 8.6 mg/dL — ABNORMAL LOW (ref 8.9–10.3)
Chloride: 110 mmol/L (ref 98–111)
Creatinine, Ser: 0.66 mg/dL (ref 0.44–1.00)
GFR calc Af Amer: >60 mL/min (ref >60)
GFR calc non Af Amer: >60 mL/min (ref >60)
Glucose, Bld: 128 mg/dL — ABNORMAL HIGH (ref 70–99)
Potassium: 4.5 mmol/L (ref 3.5–5.1)
Sodium: 139 mmol/L (ref 135–145)

## 2018-09-07 MED ORDER — IRON (FERROUS SULFATE) 325 (65 FE) MG PO TABS: 325.0000 mg | ORAL_TABLET | Freq: Every day | ORAL | 0 refills | Status: DC

## 2018-09-07 MED ORDER — PANTOPRAZOLE SODIUM 40 MG PO TBEC: 40.0000 mg | DELAYED_RELEASE_TABLET | Freq: Two times a day (BID) | ORAL | 1 refills | Status: DC

## 2018-09-07 NOTE — Plan of Care (Addendum)
Pt tolerating [place] diet well. Pt states she still feels lightheaded while ambulating.

## 2018-09-07 NOTE — TOC Initial Note (Signed)
Transition of Care [facility]) - Initial/Assessment Note    Patient Details  Name: [name] MRN: [id number] Date of Birth: [birth year]  Transition of [facility]) CM/SW Contact:    Purcell Mouton, RN Phone Number: 09/07/2018, 1:14 PM  Clinical Narrative:                  Pt discharge home with [facility].       Patient Goals and CMS Choice        Expected Discharge Plan and Services           Expected Discharge Date: 09/07/18                                    Prior Living Arrangements/Services                       Activities of Daily Living Home Assistive Devices/Equipment: Eyeglasses ADL Screening (condition at time of admission) Patient's cognitive ability adequate to safely complete daily activities?: Yes Is the patient deaf or have difficulty hearing?: No Does the patient have difficulty seeing, even when wearing glasses/contacts?: No Does the patient have difficulty concentrating, remembering, or making decisions?: No Patient able to express need for assistance with ADLs?: Yes Does the patient have difficulty dressing or bathing?: No Independently performs ADLs?: Yes (appropriate for developmental age) Does the patient have difficulty walking or climbing stairs?: Yes Weakness of Legs: Both Weakness of Arms/Hands: None  Permission Sought/Granted                  Emotional Assessment              Admission diagnosis:  Anastomotic ulcer [K28.9] Epigastric pain [R10.13] Generalized weakness [R53.1] Near syncope [R55] Symptomatic anemia [D64.9] Patient Active Problem List   Diagnosis Date Noted  . Gastric ulcer 09/06/2018  . Anemia 09/06/2018  . Hypokalemia 09/05/2018  . HTN (hypertension) 09/05/2018  . Abnormal CT of the abdomen 09/05/2018   PCP:  Lajean Manes, MD Pharmacy:   [facility] - [place], [place] - [address] [place] [address] Phone:  [phone] Fax: [phone]     Social Determinants of Health ([place]) Interventions    Readmission Risk Interventions No flowsheet data found.

## 2018-09-07 NOTE — Evaluation (Signed)
Occupational Therapy Evaluation Patient Details Name: [name] MRN: [id number] DOB: 1933/01/25 Today's Date: 09/07/2018    History of Present Illness 85 y.o. female with medical history significant of pancreatic cancer status post Whipple procedure in 2006 with recurrence in treatment in 2008 now in remission, history of C. difficile, HLD, hypothyroidism, vertigo, peripheral vascular disease and admitted for abnormal CT with possible underlining peptic ulcer disease   Clinical Impression   Pt was admitted for the above. While, she was not orthostatic, pt reported a little lightheadedness when up moving around.  Completed ADL and toileting. 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 Pt is working with a HHPT and is interested in adding [facility] services. Will follow in acute with supervision level goals    Follow Up Recommendations  Supervision/Assistance - 24 hour;Home health OT    Equipment Recommendations  None recommended by OT    Recommendations for Other Services       Precautions / Restrictions Precautions Precautions: Fall Restrictions Weight Bearing Restrictions: No      Mobility Bed Mobility         Supine to sit: Supervision     General bed mobility comments: cues to move slowly  Transfers   Equipment used: None   Sit to Stand: Min guard         General transfer comment: for safety. Min A for ambulating without RW and min guard with. She had never used one before    Balance                                           ADL either performed or assessed with clinical judgement   ADL Overall ADL's : Needs assistance/impaired                         Toilet Transfer: Min guard;Minimal assistance;Comfort height toilet;Grab bars             General ADL Comments: ambulated to bathroom with min A without RW. She has one of these at home for her husband, but he uses a cane. Pt was min  guard walking back to chair.  Min A for adls for balance at this time     Vision         Perception     Praxis      Pertinent Vitals/Pain Pain Assessment: Faces Faces Pain Scale: Hurts a little bit Pain Location: chronic back pain Pain Descriptors / Indicators: Sore Pain Intervention(s): Limited activity within patient's tolerance;Monitored during session;Repositioned     Hand Dominance     Extremity/Trunk Assessment Upper Extremity Assessment Upper Extremity Assessment: Overall WFL for tasks assessed           Communication Communication Communication: No difficulties   Cognition Arousal/Alertness: Awake/alert Behavior During Therapy: WFL for tasks assessed/performed Overall Cognitive Status: Within Functional Limits for tasks assessed                                 General Comments: very talkative   General Comments  pt was not orthostatic and initially not dizzy, but felt a little lightheaded. 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 Daughter will be staying with her for awhile.  Plan is for pt/husband to move near daughter in [place]        [place] Problem List: Decreased strength;Decreased activity tolerance;Impaired balance (sitting and/or standing);Pain      OT Treatment/Interventions: Self-care/ADL training;DME and/or AE instruction;Energy conservation;Patient/family education;Balance training;Therapeutic activities    OT Goals(Current  goals can be found in the care plan section) Acute Rehab OT Goals Patient Stated Goal: return to independence OT Goal Formulation: With patient Time For Goal Achievement: 09/21/18 Potential to Achieve Goals: Good ADL Goals Pt Will Transfer to Toilet: with supervision;ambulating Additional ADL Goal #1: pt will complete adl with set up only  OT Frequency: Min 2X/week   Barriers to D/C:            Co-evaluation              AM-PAC OT "6 Clicks" Daily Activity     Outcome Measure Help from another person eating meals?: None Help from another person taking care of personal grooming?: A Little Help from another person toileting, which includes using toliet, bedpan, or urinal?: A Little Help from another person bathing (including washing, rinsing, drying)?: A Little Help from another person to put on and taking off regular upper body clothing?: A Little Help from another person to put on and taking off regular lower body clothing?: A Little 6 Click Score: 19   End of Session    Activity Tolerance: Patient tolerated treatment well Patient left: in chair;with call bell/phone within reach  OT Visit Diagnosis: Unsteadiness on feet (R26.81)                Time: 7416-3845 OT Time Calculation (min): 26 min Charges:  OT General Charges $OT Visit: 1 Visit OT Evaluation $OT Eval Low Complexity: 1 Low OT Treatments $Self Care/Home Management : 8-22 mins  Lesle Chris, OTR/L Acute Rehabilitation Services [phone] [facility] pager [phone] office 09/07/2018  [place] 09/07/2018, 9:25 AM

## 2018-09-07 NOTE — Discharge Summary (Addendum)
Physician Discharge Summary  [name] ELF:[id number] DOB: 06/07/1932 DOA: 09/05/2018  PCP: Lajean Manes, MD  Admit date: 09/05/2018 Discharge date: 09/07/2018  Recommendations for Outpatient Follow-up:   Gastric ulcer without stigmata of bleeding --Hemoglobin stable.  No evidence of bleeding.  Continue PPI on discharge.  Follow-up with GI as an outpatient.  Patient advised not to use Voltaren gel until cleared by GI as an outpatient, note this medication still appears on her discharge medication but I did discuss this with her daughter Isaias Sakai.  Patient does not often take this medication anyway.   Follow-up Information    Clarene Essex, MD. Schedule an appointment as soon as possible for a visit in 3 week(s).   Specialty: Gastroenterology Contact information: [address] [place] [address] [phone]            Discharge Diagnoses: Principal diagnosis is #1 1. Gastric ulcer without stigmata of bleeding 2. Epigastric abdominal pain 3. Normocytic anemia probably secondary to outpatient GI bleed 4. Asymptomatic bacteriuria  Discharge Condition: improved Disposition: home with HHPT, HHOT  Diet recommendation: regular diet  Filed Weights   09/05/18 2238 09/06/18 1313  Weight: 51.2 kg 51.2 kg    History of present illness:  85 year old woman PMH pancreatic cancer, status post Whipple procedure 2006, status post recurrence 2008, now in remission presented with epigastric pain, initially treated in the outpatient setting by Dr. Watt Climes with Carafate.  Imaging suggested peptic ulcer disease and she was admitted for further evaluation.  Hospital Course:  Patient was seen by gastroenterology and underwent EGD which revealed a gastric ulcer.  Patient was treated with PPI, hemoglobin remained stable, pain resolved and patient tolerated diet.  In discussion with gastroenterology recommendation was for discharge home on PPI twice daily and outpatient  follow-up with Dr. Watt Climes in 3-4 weeks.  Hospitalization was uncomplicated.  Individual issues as below.  Gastric ulcer without stigmata of bleeding --Hemoglobin stable.  No evidence of bleeding.  Continue PPI on discharge.  Follow-up with GI as an outpatient.  Epigastric abdominal pain --Resolved  Normocytic anemia probably secondary to outpatient GI bleed --Remained stable.  Follow-up as an outpatient.  Asymptomatic bacteriuria --Completely asymptomatic.  Stop antibiotics.  Consultants   GI  Procedure  EGD Impression:               - Normal esophagus.                           - Gastric ulcer with no stigmata of bleeding.                           - No specimens collected. Moderate Sedation:      . Recommendation:           - Advance diet as tolerated.                           - Continue present medications.                           - Avoid NSAIDS                           - ppi THERAPY.   Today's assessment: S: Feels well, no pain, tolerating diet.  Ready to go home. O: Vitals:  Vitals:   09/07/18 0837 09/07/18 0840  BP: (!) 146/62 (!) 151/63  Pulse:    Resp:    Temp:    SpO2:      Constitutional:   Appears calm and comfortable Respiratory:   CTA bilaterally, no w/r/r.   Respiratory effort normal.  Cardiovascular:   RRR, no m/r/g Psychiatric:   judgement and insight appear normal   Mental status o Mood, affect appropriate  Hemoglobin stable 8.5.  WBC and platelets within normal limits.  BMP unremarkable.  Discharge Instructions  Discharge Instructions    Activity as tolerated - No restrictions   Complete by: As directed    Diet general   Complete by: As directed    Discharge instructions   Complete by: As directed    Call your physician or seek immediate medical attention for pain, weight loss, bleeding, inability to eat or worsening of condition.     Allergies as of 09/07/2018      Reactions   Ivp Dye [iodinated Diagnostic Agents]  Anaphylaxis   Pet scan Gastrografin per patient Katrina Baltimore, RN   Amoxicillin Nausea Only, Other (See Comments)   "Knocks her flat" per pt Did it involve swelling of the face/tongue/throat, SOB, or low BP? No Did it involve sudden or severe rash/hives, skin peeling, or any reaction on the inside of your mouth or nose? No Did you need to seek medical attention at a hospital or doctor's office? No When did it last happen?Over 10 years ago If all above answers are "NO", may proceed with cephalosporin use.   Ciprofloxacin Nausea And Vomiting   Dextromethorphan Polistirex Er Nausea And Vomiting   Diatrizoate Meglumine & Sodium Other (See Comments)   Unknown rxn   Flagyl [metronidazole] Nausea And Vomiting   Hydrocodone Nausea And Vomiting   Methocarbamol Nausea And Vomiting   Oxycodone Nausea And Vomiting   Prednisone Nausea And Vomiting   Able to tolerate low doses   Promethazine Hcl Nausea And Vomiting   Sucralfate Diarrhea, Nausea Only, Other (See Comments)   Stomach pain and dizziness   Tape Other (See Comments)   Skin tearing and irritation      Medication List    TAKE these medications   cholestyramine 4 GM/DOSE powder Commonly known as: QUESTRAN Take 4 g by mouth 2 (two) times daily as needed (diarrhea). Notes to patient: Take as needed per instructions.   COMPLETE MULTIVITAMIN/MINERAL PO Take 1 tablet by mouth daily. Notes to patient: Tomorrow 8/8   diclofenac sodium 1 % Gel Commonly known as: VOLTAREN Apply 2 g topically daily as needed (pain). Notes to patient: As needed per instructions   Dry Eye Relief Drops 0.2-0.2-1 % Soln Generic drug: Glycerin-Hypromellose-PEG 400 Apply 1 drop to eye 2 (two) times daily. Notes to patient: Tomorrow 8/8 AM   Iron (Ferrous Sulfate) 325 (65 Fe) MG Tabs Take 325 mg by mouth daily. Notes to patient: Tomorrow 8/8 AM   lisinopril 10 MG tablet Commonly known as: ZESTRIL Take 10 mg by mouth daily. Notes to patient: Tomorrow  8/8 AM   pantoprazole 40 MG tablet Commonly known as: Protonix Take 1 tablet (40 mg total) by mouth 2 (two) times daily. Notes to patient: Tomorrow 8/8 AM   PROBIOTIC DAILY PO Take 1 capsule by mouth daily. Notes to patient: Tomorrow 8/8 AM   sertraline 50 MG tablet Commonly known as: ZOLOFT Take 25 mg by mouth at bedtime. Notes to patient: Tomorrow 8/8 PM   vitamin C 1000 MG tablet  Take 1,000 mg by mouth daily. Notes to patient: Tomorrow 8/8 AM   Vitamin D (Ergocalciferol) 1.25 MG (50000 UT) Caps capsule Commonly known as: DRISDOL Take 50,000 Units by mouth 2 (two) times a week. Every Wednesday and Saturday Notes to patient: As scheduled      Allergies  Allergen Reactions   Ivp Dye [Iodinated Diagnostic Agents] Anaphylaxis    Pet scan Gastrografin per patient Katrina Baltimore, RN   Amoxicillin Nausea Only and Other (See Comments)    "Knocks her flat" per pt Did it involve swelling of the face/tongue/throat, SOB, or low BP? No Did it involve sudden or severe rash/hives, skin peeling, or any reaction on the inside of your mouth or nose? No Did you need to seek medical attention at a hospital or doctor's office? No When did it last happen?Over 10 years ago If all above answers are "NO", may proceed with cephalosporin use.   Ciprofloxacin Nausea And Vomiting   Dextromethorphan Polistirex Er Nausea And Vomiting   Diatrizoate Meglumine & Sodium Other (See Comments)    Unknown rxn   Flagyl [Metronidazole] Nausea And Vomiting   Hydrocodone Nausea And Vomiting   Methocarbamol Nausea And Vomiting   Oxycodone Nausea And Vomiting   Prednisone Nausea And Vomiting    Able to tolerate low doses   Promethazine Hcl Nausea And Vomiting   Sucralfate Diarrhea, Nausea Only and Other (See Comments)    Stomach pain and dizziness   Tape Other (See Comments)    Skin tearing and irritation    The results of significant diagnostics from this hospitalization (including  imaging, microbiology, ancillary and laboratory) are listed below for reference.    Significant Diagnostic Studies: Ct Abdomen Pelvis Wo Contrast  Result Date: 09/05/2018 CLINICAL DATA:  Generalized abdominal pain, nausea, vomiting, diarrhea for several days. History of pancreatic cancer with Whipple in 2007 EXAM: CT ABDOMEN AND PELVIS WITHOUT CONTRAST TECHNIQUE: Multidetector CT imaging of the abdomen and pelvis was performed following the standard protocol without IV contrast. COMPARISON:  None. FINDINGS: Lower chest: Basilar areas of atelectasis and/or scarring. Coronary calcifications. Calcifications of the aortic leaflet and mitral annulus. Cardiac size is normal. No pericardial effusion Hepatobiliary: Mild intra and extrahepatic biliary ductal dilatation postsurgical features of the Whipple procedure. Pneumobilia seen centrally within the left hepatic lobe. No concerning hepatic mass. Pancreas: Partial pancreatectomy changes from Whipple. Distal body and tail pancreatic atrophy. Difficult to assess the morphology of the surgical site in the absence of contrast medium. Spleen: Normal in size without focal abnormality. Adrenals/Urinary Tract: Adrenal glands are normal. Vascular calcium seen in the renal sinuses. Kidneys are otherwise, without renal calculi, suspicious lesion, or hydronephrosis. Bladder is largely decompressed at the time of exam and therefore poorly assessed by CT. Stomach/Bowel: Thickening of the distal esophagus. Stomach is distended with ingested material contrast medium. There is apparent thickening at the gastrojejunostomy site but this is poorly assessed given underdistention and lack of contrast. Passage of contrast media beyond this point. Adjacent inflammatory and phlegmonous features are present near the efferent limb. More distal small bowel is normal. The cecum is displaced into the right upper quadrant with the air-filled appendix along the under surface of the liver. Scattered  colonic diverticula without focal pericolonic inflammation to suggest diverticulitis. Vascular/Lymphatic: Atherosclerotic plaque within the normal caliber aorta. Reactive lymph nodes present in the upper abdomen adjacent the inflammatory features near the gastrojejunostomy site. Reproductive: Uterus is surgically absent. No concerning adnexal lesions. Other: No abdominopelvic free fluid or free gas.  No bowel containing hernias. Intermediate attenuation thickening along the anterior abdominal wall anterior to the gastric antrum. Musculoskeletal: Multilevel degenerative changes are present in the imaged portions of the spine. Findings are maximal at the thoracolumbar junction. Additional degenerative changes in the SI joints and hips. No acute osseous abnormality or suspicious osseous lesion. IMPRESSION: 1. Partial pancreatectomy changes from Whipple procedure. There is nonspecific apparent thickening and phlegmonous change about the gastrojejunostomy site but this is poorly assessed given underdistention and lack of contrast medium. Differential would include inflammation or ulceration anastomotic site. No free air or abscess formation. No evidence of obstruction. 2. Intermediate attenuation thickening along the anterior abdominal wall anterior to the gastric antrum, favored to reflect surgical material. Correlate with surgical notes. 3. Suboptimal assessment of the surgical bed in the absence of contrast medium and given lack of comparison imaging. 4.  Aortic Atherosclerosis (ICD10-I70.0). These results were called by telephone at the time of interpretation on 09/05/2018 at 4:40 pm to Dr. Lajean Saver , who verbally acknowledged these results. Electronically Signed   By: Lovena Le M.D.   On: 09/05/2018 16:41    Microbiology: Recent Results (from the past 240 hour(s))  Urine Culture     Status: Abnormal (Preliminary result)   Collection Time: 09/05/18  3:07 PM   Specimen: Urine, Random  Result Value Ref  Range Status   Specimen Description   Final    URINE, RANDOM Performed at [facility] [address] [place], [place] [address]    Special Requests   Final    NONE Performed at [facility], [place] [address] [place], [place] [address]    Culture (A)  Final    >=100,000 COLONIES/mL GRAM NEGATIVE RODS IDENTIFICATION AND SUSCEPTIBILITIES TO FOLLOW Performed at [facility], [place] [address] [place], [place] [address]    Report Status PENDING  Incomplete  C difficile quick scan w PCR reflex     Status: None   Collection Time: 09/05/18  3:59 PM   Specimen: STOOL  Result Value Ref Range Status   C Diff antigen NEGATIVE NEGATIVE Final   C Diff toxin NEGATIVE NEGATIVE Final   C Diff interpretation No C. difficile detected.  Final    Comment: Performed at [facility], [place] [address], [place], [place] [address]  SARS Coronavirus 2 [facility] order, Performed in [facility] hospital lab) Nasopharyngeal Nasopharyngeal Swab     Status: None   Collection Time: 09/05/18  4:45 PM   Specimen: Nasopharyngeal Swab  Result Value Ref Range Status   SARS Coronavirus 2 NEGATIVE NEGATIVE Final    Comment: (NOTE) If result is NEGATIVE SARS-CoV-2 target nucleic acids are NOT DETECTED. The SARS-CoV-2 RNA is generally detectable in upper and lower  respiratory specimens during the acute phase of infection. The lowest  concentration of SARS-CoV-2 viral copies this assay can detect is 250  copies / mL. A negative result does not preclude SARS-CoV-2 infection  and should not be used as the sole basis for treatment or other  patient management decisions.  A negative result may occur with  improper specimen collection / handling, submission of specimen other  than nasopharyngeal swab, presence of viral mutation(s) within the  areas targeted by this assay, and inadequate number of viral copies  (<250 copies / mL). A negative result must be  combined with clinical  observations, patient history, and epidemiological information. If result is POSITIVE SARS-CoV-2 target nucleic acids are DETECTED. The SARS-CoV-2 RNA  is generally detectable in upper and lower  respiratory specimens dur ing the acute phase of infection.  Positive  results are indicative of active infection with SARS-CoV-2.  Clinical  correlation with patient history and other diagnostic information is  necessary to determine patient infection status.  Positive results do  not rule out bacterial infection or co-infection with other viruses. If result is PRESUMPTIVE POSTIVE SARS-CoV-2 nucleic acids MAY BE PRESENT.   A presumptive positive result was obtained on the submitted specimen  and confirmed on repeat testing.  While 2019 novel coronavirus  (SARS-CoV-2) nucleic acids may be present in the submitted sample  additional confirmatory testing may be necessary for epidemiological  and / or clinical management purposes  to differentiate between  SARS-CoV-2 and other Sarbecovirus currently known to infect humans.  If clinically indicated additional testing with an alternate test  methodology [phone]) is advised. The SARS-CoV-2 RNA is generally  detectable in upper and lower respiratory sp ecimens during the acute  phase of infection. The expected result is Negative. Fact Sheet for Patients:  StrictlyIdeas.no Fact Sheet for Healthcare Providers: BankingDealers.co.za This test is not yet approved or cleared by the [place] FDA and has been authorized for detection and/or diagnosis of SARS-CoV-2 by FDA under an Emergency Use Authorization (EUA).  This EUA will remain in effect (meaning this test can be used) for the duration of the COVID-19 declaration under Section 564(b)(1) of the Act, 21 U.S.C. section 360bbb-3(b)(1), unless the authorization is terminated or revoked sooner. Performed at [facility], [place] [address], [place], [place] [address]      Labs: Basic Metabolic Panel: Recent Labs  Lab 09/05/18 1124 09/06/18 0406 09/07/18 0350  NA 136 137 139  K 2.9* 4.3 4.5  CL 103 113* 110  CO2 23 18* 24  GLUCOSE 178* 127* 128*  BUN 23 11 9   CREATININE 0.87 0.63 0.66  CALCIUM 8.7* 8.0* 8.6*  MG 2.0 1.8  --   PHOS  --  2.7  --    Liver Function Tests: Recent Labs  Lab 09/06/18 0406  AST 17  ALT 14  ALKPHOS 48  BILITOT 0.4  PROT 5.2*  ALBUMIN 2.7*   CBC: Recent Labs  Lab 09/05/18 1124 09/06/18 0406 09/06/18 0937 09/06/18 1610 09/07/18 0350  WBC 7.8 5.9 5.5 5.9 6.0  HGB 9.3* 7.8* 8.1* 8.6* 8.5*  HCT 30.0* 26.2* 26.4* 28.1* 26.9*  MCV 86.7 88.8 88.3 88.4 87.1  PLT 294 249 261 254 249   CBG: Recent Labs  Lab 09/05/18 1134  GLUCAP 169*    Principal Problem:   Gastric ulcer Active Problems:   Hypokalemia   HTN (hypertension)   Abnormal CT of the abdomen   Anemia   Time coordinating discharge: 25 minutes  Signed:  Murray Hodgkins, MD  [facility]  09/07/2018, 5:59 PM

## 2018-09-08 LAB — URINE CULTURE: Culture: >=100000 — AB

## 2018-09-09 ENCOUNTER — Encounter (HOSPITAL_COMMUNITY): Payer: Self-pay | Admitting: Gastroenterology

## 2018-09-10 ENCOUNTER — Other Ambulatory Visit: Payer: Self-pay

## 2018-09-10 DIAGNOSIS — E039 Hypothyroidism, unspecified: Secondary | ICD-10-CM | POA: Diagnosis not present

## 2018-09-10 DIAGNOSIS — F321 Major depressive disorder, single episode, moderate: Secondary | ICD-10-CM | POA: Diagnosis not present

## 2018-09-10 DIAGNOSIS — M47812 Spondylosis without myelopathy or radiculopathy, cervical region: Secondary | ICD-10-CM | POA: Diagnosis not present

## 2018-09-10 DIAGNOSIS — F324 Major depressive disorder, single episode, in partial remission: Secondary | ICD-10-CM | POA: Diagnosis not present

## 2018-09-10 DIAGNOSIS — F325 Major depressive disorder, single episode, in full remission: Secondary | ICD-10-CM | POA: Diagnosis not present

## 2018-09-10 DIAGNOSIS — E1169 Type 2 diabetes mellitus with other specified complication: Secondary | ICD-10-CM | POA: Diagnosis not present

## 2018-09-10 DIAGNOSIS — I10 Essential (primary) hypertension: Secondary | ICD-10-CM | POA: Diagnosis not present

## 2018-09-10 DIAGNOSIS — M8580 Other specified disorders of bone density and structure, unspecified site: Secondary | ICD-10-CM | POA: Diagnosis not present

## 2018-09-10 DIAGNOSIS — D508 Other iron deficiency anemias: Secondary | ICD-10-CM | POA: Diagnosis not present

## 2018-09-10 DIAGNOSIS — E119 Type 2 diabetes mellitus without complications: Secondary | ICD-10-CM | POA: Diagnosis not present

## 2018-09-10 DIAGNOSIS — F320 Major depressive disorder, single episode, mild: Secondary | ICD-10-CM | POA: Diagnosis not present

## 2018-09-10 NOTE — Patient Outreach (Signed)
[facility]) Care Management  09/10/2018  [name] 03/23/1932 [id number]   EMMI- General Discharge RED ON EMMI ALERT Day # 1 Date: 09/09/2018 Red Alert Reason:  Unfilled prescriptions? Yes  Other questions/problems? Yes    Outreach attempt: spoke with patient.  She states she is doing much better.  Patient states she does not have her medications as her pharmacy was not open but will be working on getting her medication today.  Patient wanted to verify Protonix dose. Advised patient according to discharge summary it is Protonix 40 my twice a day. She verbalized understanding and declines any further needs or support at this time.    Plan: RN CM will close case  Jone Baseman, RN, MSN [place] Management Care Management Coordinator Direct Line [phone] Toll Free: [phone]  Fax: [phone]

## 2018-09-11 ENCOUNTER — Ambulatory Visit (HOSPITAL_COMMUNITY): Payer: Medicare Other

## 2018-09-14 DIAGNOSIS — M47812 Spondylosis without myelopathy or radiculopathy, cervical region: Secondary | ICD-10-CM | POA: Diagnosis not present

## 2018-09-18 DIAGNOSIS — M47812 Spondylosis without myelopathy or radiculopathy, cervical region: Secondary | ICD-10-CM | POA: Diagnosis not present

## 2018-09-19 DIAGNOSIS — M47812 Spondylosis without myelopathy or radiculopathy, cervical region: Secondary | ICD-10-CM | POA: Diagnosis not present

## 2018-09-24 DIAGNOSIS — M47812 Spondylosis without myelopathy or radiculopathy, cervical region: Secondary | ICD-10-CM | POA: Diagnosis not present

## 2018-10-01 DIAGNOSIS — M47812 Spondylosis without myelopathy or radiculopathy, cervical region: Secondary | ICD-10-CM | POA: Diagnosis not present

## 2018-10-04 DIAGNOSIS — M5124 Other intervertebral disc displacement, thoracic region: Secondary | ICD-10-CM | POA: Diagnosis not present

## 2018-10-04 DIAGNOSIS — M5414 Radiculopathy, thoracic region: Secondary | ICD-10-CM | POA: Diagnosis not present

## 2018-10-15 DIAGNOSIS — K8689 Other specified diseases of pancreas: Secondary | ICD-10-CM | POA: Diagnosis not present

## 2018-10-15 DIAGNOSIS — D508 Other iron deficiency anemias: Secondary | ICD-10-CM | POA: Diagnosis not present

## 2018-10-17 DIAGNOSIS — D508 Other iron deficiency anemias: Secondary | ICD-10-CM | POA: Diagnosis not present

## 2018-11-09 ENCOUNTER — Ambulatory Visit: Payer: PRIVATE HEALTH INSURANCE | Admitting: Podiatry

## 2018-11-12 ENCOUNTER — Ambulatory Visit: Payer: PRIVATE HEALTH INSURANCE | Admitting: Podiatry

## 2018-11-22 ENCOUNTER — Ambulatory Visit: Payer: Medicare Other | Admitting: Podiatry

## 2018-11-27 DIAGNOSIS — Z8507 Personal history of malignant neoplasm of pancreas: Secondary | ICD-10-CM | POA: Diagnosis not present

## 2018-11-27 DIAGNOSIS — D508 Other iron deficiency anemias: Secondary | ICD-10-CM | POA: Diagnosis not present

## 2018-11-28 DIAGNOSIS — M5416 Radiculopathy, lumbar region: Secondary | ICD-10-CM | POA: Diagnosis not present

## 2018-11-30 DIAGNOSIS — E039 Hypothyroidism, unspecified: Secondary | ICD-10-CM | POA: Diagnosis not present

## 2018-11-30 DIAGNOSIS — I10 Essential (primary) hypertension: Secondary | ICD-10-CM | POA: Diagnosis not present

## 2018-11-30 DIAGNOSIS — E1169 Type 2 diabetes mellitus with other specified complication: Secondary | ICD-10-CM | POA: Diagnosis not present

## 2018-11-30 DIAGNOSIS — F325 Major depressive disorder, single episode, in full remission: Secondary | ICD-10-CM | POA: Diagnosis not present

## 2018-11-30 DIAGNOSIS — M8580 Other specified disorders of bone density and structure, unspecified site: Secondary | ICD-10-CM | POA: Diagnosis not present

## 2018-12-03 DIAGNOSIS — M5415 Radiculopathy, thoracolumbar region: Secondary | ICD-10-CM | POA: Diagnosis not present

## 2018-12-03 DIAGNOSIS — M5116 Intervertebral disc disorders with radiculopathy, lumbar region: Secondary | ICD-10-CM | POA: Diagnosis not present

## 2018-12-27 DIAGNOSIS — E039 Hypothyroidism, unspecified: Secondary | ICD-10-CM | POA: Diagnosis not present

## 2018-12-27 DIAGNOSIS — E1169 Type 2 diabetes mellitus with other specified complication: Secondary | ICD-10-CM | POA: Diagnosis not present

## 2018-12-27 DIAGNOSIS — I10 Essential (primary) hypertension: Secondary | ICD-10-CM | POA: Diagnosis not present

## 2018-12-27 DIAGNOSIS — F325 Major depressive disorder, single episode, in full remission: Secondary | ICD-10-CM | POA: Diagnosis not present

## 2018-12-27 DIAGNOSIS — M8580 Other specified disorders of bone density and structure, unspecified site: Secondary | ICD-10-CM | POA: Diagnosis not present

## 2019-01-29 DIAGNOSIS — E039 Hypothyroidism, unspecified: Secondary | ICD-10-CM | POA: Diagnosis not present

## 2019-01-29 DIAGNOSIS — M8580 Other specified disorders of bone density and structure, unspecified site: Secondary | ICD-10-CM | POA: Diagnosis not present

## 2019-01-29 DIAGNOSIS — F325 Major depressive disorder, single episode, in full remission: Secondary | ICD-10-CM | POA: Diagnosis not present

## 2019-01-29 DIAGNOSIS — I10 Essential (primary) hypertension: Secondary | ICD-10-CM | POA: Diagnosis not present

## 2019-01-29 DIAGNOSIS — E1169 Type 2 diabetes mellitus with other specified complication: Secondary | ICD-10-CM | POA: Diagnosis not present

## 2019-02-04 DIAGNOSIS — M8580 Other specified disorders of bone density and structure, unspecified site: Secondary | ICD-10-CM | POA: Diagnosis not present

## 2019-02-04 DIAGNOSIS — E1169 Type 2 diabetes mellitus with other specified complication: Secondary | ICD-10-CM | POA: Diagnosis not present

## 2019-02-04 DIAGNOSIS — E039 Hypothyroidism, unspecified: Secondary | ICD-10-CM | POA: Diagnosis not present

## 2019-02-04 DIAGNOSIS — F325 Major depressive disorder, single episode, in full remission: Secondary | ICD-10-CM | POA: Diagnosis not present

## 2019-02-04 DIAGNOSIS — I10 Essential (primary) hypertension: Secondary | ICD-10-CM | POA: Diagnosis not present

## 2019-03-01 DIAGNOSIS — E1169 Type 2 diabetes mellitus with other specified complication: Secondary | ICD-10-CM | POA: Diagnosis not present

## 2019-03-01 DIAGNOSIS — Z79899 Other long term (current) drug therapy: Secondary | ICD-10-CM | POA: Diagnosis not present

## 2019-03-01 DIAGNOSIS — I10 Essential (primary) hypertension: Secondary | ICD-10-CM | POA: Diagnosis not present

## 2019-03-05 DIAGNOSIS — M5416 Radiculopathy, lumbar region: Secondary | ICD-10-CM | POA: Diagnosis not present

## 2019-03-05 DIAGNOSIS — M5116 Intervertebral disc disorders with radiculopathy, lumbar region: Secondary | ICD-10-CM | POA: Diagnosis not present

## 2019-03-11 DIAGNOSIS — E1169 Type 2 diabetes mellitus with other specified complication: Secondary | ICD-10-CM | POA: Diagnosis not present

## 2019-03-11 DIAGNOSIS — Z79899 Other long term (current) drug therapy: Secondary | ICD-10-CM | POA: Diagnosis not present

## 2019-03-29 DIAGNOSIS — M8580 Other specified disorders of bone density and structure, unspecified site: Secondary | ICD-10-CM | POA: Diagnosis not present

## 2019-03-29 DIAGNOSIS — E039 Hypothyroidism, unspecified: Secondary | ICD-10-CM | POA: Diagnosis not present

## 2019-03-29 DIAGNOSIS — I10 Essential (primary) hypertension: Secondary | ICD-10-CM | POA: Diagnosis not present

## 2019-03-29 DIAGNOSIS — E1169 Type 2 diabetes mellitus with other specified complication: Secondary | ICD-10-CM | POA: Diagnosis not present

## 2019-03-29 DIAGNOSIS — F325 Major depressive disorder, single episode, in full remission: Secondary | ICD-10-CM | POA: Diagnosis not present

## 2019-04-08 DIAGNOSIS — E1169 Type 2 diabetes mellitus with other specified complication: Secondary | ICD-10-CM | POA: Diagnosis not present

## 2019-04-08 DIAGNOSIS — I10 Essential (primary) hypertension: Secondary | ICD-10-CM | POA: Diagnosis not present

## 2019-04-08 DIAGNOSIS — M8580 Other specified disorders of bone density and structure, unspecified site: Secondary | ICD-10-CM | POA: Diagnosis not present

## 2019-04-08 DIAGNOSIS — F325 Major depressive disorder, single episode, in full remission: Secondary | ICD-10-CM | POA: Diagnosis not present

## 2019-04-08 DIAGNOSIS — E039 Hypothyroidism, unspecified: Secondary | ICD-10-CM | POA: Diagnosis not present

## 2019-05-06 ENCOUNTER — Telehealth: Payer: Self-pay | Admitting: *Deleted

## 2019-05-06 NOTE — Telephone Encounter (Signed)
Pt's dtr, Tess states pt needs to be seen soon she has corns and redness between her toes.

## 2019-05-06 NOTE — Telephone Encounter (Signed)
I spoke with pt's dtr, Tess and she states pt and husband are scheduled tomorrow morning.

## 2019-05-07 ENCOUNTER — Ambulatory Visit: Payer: Medicare Other

## 2019-05-07 ENCOUNTER — Other Ambulatory Visit: Payer: Self-pay

## 2019-05-07 ENCOUNTER — Ambulatory Visit (INDEPENDENT_AMBULATORY_CARE_PROVIDER_SITE_OTHER): Payer: Medicare Other | Admitting: Podiatry

## 2019-05-07 DIAGNOSIS — M79672 Pain in left foot: Secondary | ICD-10-CM

## 2019-05-07 DIAGNOSIS — M2011 Hallux valgus (acquired), right foot: Secondary | ICD-10-CM | POA: Diagnosis not present

## 2019-05-07 DIAGNOSIS — M2042 Other hammer toe(s) (acquired), left foot: Secondary | ICD-10-CM

## 2019-05-07 DIAGNOSIS — M79671 Pain in right foot: Secondary | ICD-10-CM

## 2019-05-07 DIAGNOSIS — M2041 Other hammer toe(s) (acquired), right foot: Secondary | ICD-10-CM | POA: Diagnosis not present

## 2019-05-07 DIAGNOSIS — M2012 Hallux valgus (acquired), left foot: Secondary | ICD-10-CM | POA: Diagnosis not present

## 2019-05-08 ENCOUNTER — Ambulatory Visit: Payer: PRIVATE HEALTH INSURANCE | Admitting: Podiatry

## 2019-05-14 DIAGNOSIS — C250 Malignant neoplasm of head of pancreas: Secondary | ICD-10-CM | POA: Diagnosis not present

## 2019-05-14 DIAGNOSIS — R918 Other nonspecific abnormal finding of lung field: Secondary | ICD-10-CM | POA: Diagnosis not present

## 2019-05-14 DIAGNOSIS — C787 Secondary malignant neoplasm of liver and intrahepatic bile duct: Secondary | ICD-10-CM | POA: Diagnosis not present

## 2019-05-14 DIAGNOSIS — R197 Diarrhea, unspecified: Secondary | ICD-10-CM | POA: Diagnosis not present

## 2019-05-14 DIAGNOSIS — Z8507 Personal history of malignant neoplasm of pancreas: Secondary | ICD-10-CM | POA: Diagnosis not present

## 2019-05-14 DIAGNOSIS — Z08 Encounter for follow-up examination after completed treatment for malignant neoplasm: Secondary | ICD-10-CM | POA: Diagnosis not present

## 2019-05-14 DIAGNOSIS — C259 Malignant neoplasm of pancreas, unspecified: Secondary | ICD-10-CM | POA: Diagnosis not present

## 2019-05-14 DIAGNOSIS — R911 Solitary pulmonary nodule: Secondary | ICD-10-CM | POA: Diagnosis not present

## 2019-06-18 DIAGNOSIS — I10 Essential (primary) hypertension: Secondary | ICD-10-CM | POA: Diagnosis not present

## 2019-06-18 DIAGNOSIS — E039 Hypothyroidism, unspecified: Secondary | ICD-10-CM | POA: Diagnosis not present

## 2019-06-18 DIAGNOSIS — F325 Major depressive disorder, single episode, in full remission: Secondary | ICD-10-CM | POA: Diagnosis not present

## 2019-06-18 DIAGNOSIS — M8580 Other specified disorders of bone density and structure, unspecified site: Secondary | ICD-10-CM | POA: Diagnosis not present

## 2019-06-18 DIAGNOSIS — E1169 Type 2 diabetes mellitus with other specified complication: Secondary | ICD-10-CM | POA: Diagnosis not present

## 2019-06-20 ENCOUNTER — Ambulatory Visit: Payer: PRIVATE HEALTH INSURANCE | Admitting: Podiatry

## 2019-07-03 ENCOUNTER — Ambulatory Visit: Payer: Medicare Other | Admitting: Cardiology

## 2019-07-12 DIAGNOSIS — K8689 Other specified diseases of pancreas: Secondary | ICD-10-CM | POA: Diagnosis not present

## 2019-07-12 DIAGNOSIS — D692 Other nonthrombocytopenic purpura: Secondary | ICD-10-CM | POA: Diagnosis not present

## 2019-07-12 DIAGNOSIS — F325 Major depressive disorder, single episode, in full remission: Secondary | ICD-10-CM | POA: Diagnosis not present

## 2019-07-12 DIAGNOSIS — K2100 Gastro-esophageal reflux disease with esophagitis, without bleeding: Secondary | ICD-10-CM | POA: Diagnosis not present

## 2019-07-12 DIAGNOSIS — Z0000 Encounter for general adult medical examination without abnormal findings: Secondary | ICD-10-CM | POA: Diagnosis not present

## 2019-07-12 DIAGNOSIS — E039 Hypothyroidism, unspecified: Secondary | ICD-10-CM | POA: Diagnosis not present

## 2019-07-12 DIAGNOSIS — Z1389 Encounter for screening for other disorder: Secondary | ICD-10-CM | POA: Diagnosis not present

## 2019-07-12 DIAGNOSIS — Z7984 Long term (current) use of oral hypoglycemic drugs: Secondary | ICD-10-CM | POA: Diagnosis not present

## 2019-07-12 DIAGNOSIS — I10 Essential (primary) hypertension: Secondary | ICD-10-CM | POA: Diagnosis not present

## 2019-07-12 DIAGNOSIS — E1169 Type 2 diabetes mellitus with other specified complication: Secondary | ICD-10-CM | POA: Diagnosis not present

## 2019-07-12 DIAGNOSIS — Z8507 Personal history of malignant neoplasm of pancreas: Secondary | ICD-10-CM | POA: Diagnosis not present

## 2019-08-05 NOTE — Progress Notes (Signed)
  Subjective:  Patient ID: [name], female    DOB: November 11, 1932,  MRN: [id number]  Chief Complaint  Patient presents with  . Bunions    Painful bunions bilateral  . Callouses    Painful sub 5th callouses  . Callouses    Interdigital corns 1/2/3 bilateral    86 y.o. female presents with the above complaint. History confirmed with patient.   Objective:  Physical Exam: warm, good capillary refill, no trophic changes or ulcerative lesions, normal DP and PT pulses and normal sensory exam. Hallux valgus bilaterally with pain to palpation, lesser digital contractures bilaterally.  Hyperkeratosis interdigitally first second third toes   Assessment:   1. Hammertoe of left foot   2. Hammertoe of right foot   3. Acquired hallux valgus of both feet      Plan:  Patient was evaluated and treated and all questions answered.  Hammertoes, Bunions bilat -Toe spacers dispensed -Corns debrided Vicente Serene -Discussed she may benefit from surgical invention  Return in about 6 weeks (around 06/18/2019) for Hammertoe f/u bilat.

## 2019-08-08 ENCOUNTER — Ambulatory Visit: Payer: Medicare Other | Admitting: Podiatry

## 2019-08-14 DIAGNOSIS — G8929 Other chronic pain: Secondary | ICD-10-CM | POA: Diagnosis not present

## 2019-08-14 DIAGNOSIS — E1169 Type 2 diabetes mellitus with other specified complication: Secondary | ICD-10-CM | POA: Diagnosis not present

## 2019-08-14 DIAGNOSIS — Z79899 Other long term (current) drug therapy: Secondary | ICD-10-CM | POA: Diagnosis not present

## 2019-08-14 DIAGNOSIS — I10 Essential (primary) hypertension: Secondary | ICD-10-CM | POA: Diagnosis not present

## 2019-08-14 DIAGNOSIS — L84 Corns and callosities: Secondary | ICD-10-CM | POA: Diagnosis not present

## 2019-08-14 DIAGNOSIS — M549 Dorsalgia, unspecified: Secondary | ICD-10-CM | POA: Diagnosis not present

## 2019-08-14 DIAGNOSIS — I7300 Raynaud's syndrome without gangrene: Secondary | ICD-10-CM | POA: Diagnosis not present

## 2019-08-14 DIAGNOSIS — Z7984 Long term (current) use of oral hypoglycemic drugs: Secondary | ICD-10-CM | POA: Diagnosis not present

## 2019-08-14 DIAGNOSIS — K8689 Other specified diseases of pancreas: Secondary | ICD-10-CM | POA: Diagnosis not present

## 2019-08-19 DIAGNOSIS — M5116 Intervertebral disc disorders with radiculopathy, lumbar region: Secondary | ICD-10-CM | POA: Diagnosis not present

## 2019-08-19 DIAGNOSIS — M5416 Radiculopathy, lumbar region: Secondary | ICD-10-CM | POA: Diagnosis not present

## 2019-08-28 DIAGNOSIS — F4321 Adjustment disorder with depressed mood: Secondary | ICD-10-CM | POA: Diagnosis not present

## 2019-08-28 DIAGNOSIS — K8681 Exocrine pancreatic insufficiency: Secondary | ICD-10-CM | POA: Diagnosis not present

## 2019-08-28 DIAGNOSIS — C250 Malignant neoplasm of head of pancreas: Secondary | ICD-10-CM | POA: Diagnosis not present

## 2019-08-31 DIAGNOSIS — I10 Essential (primary) hypertension: Secondary | ICD-10-CM | POA: Diagnosis not present

## 2019-08-31 DIAGNOSIS — F325 Major depressive disorder, single episode, in full remission: Secondary | ICD-10-CM | POA: Diagnosis not present

## 2019-08-31 DIAGNOSIS — E039 Hypothyroidism, unspecified: Secondary | ICD-10-CM | POA: Diagnosis not present

## 2019-08-31 DIAGNOSIS — M8580 Other specified disorders of bone density and structure, unspecified site: Secondary | ICD-10-CM | POA: Diagnosis not present

## 2019-08-31 DIAGNOSIS — E1169 Type 2 diabetes mellitus with other specified complication: Secondary | ICD-10-CM | POA: Diagnosis not present

## 2019-09-01 NOTE — Progress Notes (Deleted)
Cardiology Office Note:    Date:  09/01/2019   ID:  [name], DOB [birth year], MRN [id number]  PCP:  Lajean Manes, MD  [facility] Cardiologist:  Candee Furbish, MD  [facility] Electrophysiologist:  None   Referring MD: Lajean Manes, MD    History of Present Illness:    [name] is a 86 y.o. female here for the follow-up of prior atypical chest pain and lower extremity edema.  Past Medical History:  Diagnosis Date  . Aortic valve sclerosis   . C. difficile colitis   . Hyperlipidemia   . Hypothyroid   . Mild recurrent major depression ([place])   . Osteopenia   . Pancreatic cancer [facility])    Pancreatic  . Vertigo   . Vitamin D deficiency     Past Surgical History:  Procedure Laterality Date  . BLADDER SUSPENSION  1999  . CATARACT EXTRACTION  2011   bilateral  . CHOLECYSTECTOMY  1992  . ESOPHAGOGASTRODUODENOSCOPY (EGD) WITH PROPOFOL N/A 09/06/2018   Procedure: ESOPHAGOGASTRODUODENOSCOPY (EGD) WITH PROPOFOL;  Surgeon: Wonda Horner, MD;  Location: [facility];  Service: Endoscopy;  Laterality: N/A;  . lower back surgery  1994, 1995   L4-5  . multiple hernias  2007, 2012   ventral  . PARTIAL HYSTERECTOMY  1973  . SHOULDER ARTHROSCOPY  2011   rt  . TONSILLECTOMY  1942  . WHIPPLE PROCEDURE  2006    Current Medications: No outpatient medications have been marked as taking for the 09/03/19 encounter (Appointment) with Jerline Pain, MD.     Allergies:   Ivp dye [iodinated diagnostic agents], Amoxicillin, Ciprofloxacin, Dextromethorphan polistirex er, Diatrizoate meglumine & sodium, Flagyl [metronidazole], Hydrocodone, Methocarbamol, Oxycodone, Prednisone, Promethazine hcl, Sucralfate, and Tape   Social History   Socioeconomic History  . Marital status: Married    Spouse name: Not on file  . Number of children: Not on file  . Years of education: Not on file  . Highest education level: Not on file  Occupational History  . Not on file  Tobacco  Use  . Smoking status: Never Smoker  . Smokeless tobacco: Never Used  Vaping Use  . Vaping Use: Never used  Substance and Sexual Activity  . Alcohol use: Yes  . Drug use: Never  . Sexual activity: Not on file  Other Topics Concern  . Not on file  Social History Narrative  . Not on file   Social Determinants of Health   Financial Resource Strain:   . Difficulty of Paying Living Expenses:   Food Insecurity:   . Worried About Charity fundraiser in the Last Year:   . Arboriculturist in the Last Year:   Transportation Needs:   . Film/video editor (Medical):   Marland Kitchen Lack of Transportation (Non-Medical):   Physical Activity:   . Days of Exercise per Week:   . Minutes of Exercise per Session:   Stress:   . Feeling of Stress :   Social Connections:   . Frequency of Communication with Friends and Family:   . Frequency of Social Gatherings with Friends and Family:   . Attends Religious Services:   . Active Member of Clubs or Organizations:   . Attends Archivist Meetings:   Marland Kitchen Marital Status:      Family History: The patient's ***family history includes Breast cancer in her sister; Cancer in her maternal grandfather and sister; Heart attack in her father; Heart disease in her maternal grandmother  and mother; Heart failure in her mother; Hypertension in her mother, sister, and sister.  ROS:   Please see the history of present illness.    *** All other systems reviewed and are negative.  EKGs/Labs/Other Studies Reviewed:    The following studies were reviewed today: ***  EKG:  EKG is *** ordered today.  The ekg ordered today demonstrates ***  Recent Labs: 09/06/2018: ALT 14; Magnesium 1.8; TSH 2.463 09/07/2018: BUN 9; Creatinine, Ser 0.66; Hemoglobin 8.5; Platelets 249; Potassium 4.5; Sodium 139  Recent Lipid Panel No results found for: CHOL, TRIG, HDL, CHOLHDL, VLDL, LDLCALC, LDLDIRECT  Physical Exam:    VS:  There were no vitals taken for this visit.    Wt  Readings from Last 3 Encounters:  09/06/18 112 lb 14 oz (51.2 kg)  06/26/18 119 lb (54 kg)  03/24/17 129 lb (58.5 kg)     GEN: *** Well nourished, well developed in no acute distress HEENT: Normal NECK: No JVD; No carotid bruits LYMPHATICS: No lymphadenopathy CARDIAC: ***RRR, no murmurs, rubs, gallops RESPIRATORY:  Clear to auscultation without rales, wheezing or rhonchi  ABDOMEN: Soft, non-tender, non-distended MUSCULOSKELETAL:  No edema; No deformity  SKIN: Warm and dry NEUROLOGIC:  Alert and oriented x 3 PSYCHIATRIC:  Normal affect   ASSESSMENT:    No diagnosis found. PLAN:    In order of problems listed above:  Prior atypical chest pain -Nuclear stress test in the past reassuring.  Likely GI related with her recent gastric ulcer with no stigmata of bleeding noted during 09/07/2018 hospitalization. -She was advised not to use NSAIDs at that time.  Depression/anxiety -Certainly playing a role in her overall state of health.  Lower extremity edema -Low-dose Lasix has been utilized in the past.  Low protein intake likely contributing as well.   Medication Adjustments/Labs and Tests Ordered: Current medicines are reviewed at length with the patient today.  Concerns regarding medicines are outlined above.  No orders of the defined types were placed in this encounter.  No orders of the defined types were placed in this encounter.   There are no Patient Instructions on file for this visit.   Signed, Candee Furbish, MD  09/01/2019 3:52 PM    [facility]

## 2019-09-03 ENCOUNTER — Ambulatory Visit: Payer: Medicare Other | Admitting: Cardiology

## 2019-09-09 ENCOUNTER — Ambulatory Visit: Payer: Medicare Other | Attending: Geriatric Medicine | Admitting: Physical Therapy

## 2019-09-09 ENCOUNTER — Ambulatory Visit: Payer: Medicare Other | Admitting: Podiatrist

## 2019-09-09 DIAGNOSIS — R2689 Other abnormalities of gait and mobility: Secondary | ICD-10-CM | POA: Insufficient documentation

## 2019-09-09 DIAGNOSIS — M6281 Muscle weakness (generalized): Secondary | ICD-10-CM | POA: Insufficient documentation

## 2019-09-12 ENCOUNTER — Ambulatory Visit: Payer: Medicare Other | Admitting: Physical Therapy

## 2019-09-13 DIAGNOSIS — I700 Atherosclerosis of aorta: Secondary | ICD-10-CM | POA: Diagnosis not present

## 2019-09-13 DIAGNOSIS — I7300 Raynaud's syndrome without gangrene: Secondary | ICD-10-CM | POA: Diagnosis not present

## 2019-09-13 DIAGNOSIS — I10 Essential (primary) hypertension: Secondary | ICD-10-CM | POA: Diagnosis not present

## 2019-09-13 DIAGNOSIS — E1169 Type 2 diabetes mellitus with other specified complication: Secondary | ICD-10-CM | POA: Diagnosis not present

## 2019-09-16 ENCOUNTER — Other Ambulatory Visit: Payer: Self-pay

## 2019-09-16 ENCOUNTER — Encounter: Payer: Self-pay | Admitting: Physical Therapy

## 2019-09-16 ENCOUNTER — Ambulatory Visit: Payer: Medicare Other | Admitting: Physical Therapy

## 2019-09-16 DIAGNOSIS — M6281 Muscle weakness (generalized): Secondary | ICD-10-CM

## 2019-09-16 DIAGNOSIS — R2689 Other abnormalities of gait and mobility: Secondary | ICD-10-CM

## 2019-09-16 NOTE — Patient Instructions (Signed)
Access Code: [email or web address] URL: [email or web address] Date: 09/16/2019 Prepared by: Jari Favre  Exercises Clamshell - 1 x daily - 7 x weekly - 3 sets - 10 reps Seated Hamstring Stretch - 1 x daily - 7 x weekly - 3 reps - 1 sets - 30 sec hold

## 2019-09-16 NOTE — Therapy (Signed)
[facility] [address] [place], [place], [address] Phone: [phone]   Fax:  [phone]  Physical Therapy Evaluation  Patient Details  Name: [name] MRN: [id number] Date of Birth: [birth year] Referring Provider (PT): Lajean Manes, MD   Encounter Date: 09/16/2019   PT End of Session - 09/16/19 0758    Visit Number 1    Date for PT Re-Evaluation 12/09/19    Authorization Type medicare    PT Start Time 0759    PT Stop Time 0842    PT Time Calculation (min) 43 min    Activity Tolerance Patient tolerated treatment well    Behavior During Therapy [facility] for tasks assessed/performed           Past Medical History:  Diagnosis Date  . Aortic valve sclerosis   . C. difficile colitis   . Hyperlipidemia   . Hypothyroid   . Mild recurrent major depression ([place])   . Osteopenia   . Pancreatic cancer [facility])    Pancreatic  . Vertigo   . Vitamin D deficiency     Past Surgical History:  Procedure Laterality Date  . BLADDER SUSPENSION  1999  . CATARACT EXTRACTION  2011   bilateral  . CHOLECYSTECTOMY  1992  . ESOPHAGOGASTRODUODENOSCOPY (EGD) WITH PROPOFOL N/A 09/06/2018   Procedure: ESOPHAGOGASTRODUODENOSCOPY (EGD) WITH PROPOFOL;  Surgeon: Wonda Horner, MD;  [facility];  Service: Endoscopy;  Laterality: N/A;  . lower back surgery  1994, 1995   L4-5  . multiple hernias  2007, 2012   ventral  . PARTIAL HYSTERECTOMY  1973  . SHOULDER ARTHROSCOPY  2011   rt  . TONSILLECTOMY  1942  . WHIPPLE PROCEDURE  2006    There were no vitals filed for this visit.    Subjective Assessment - 09/16/19 0805    Subjective I feel like my stability is 4-5/10 (10 is the best).  I have had extreme back pain and have been getting epidurals for that.  I notice that when I slump it is hurting more.    Pertinent History husband passed July of this year    How long can you stand comfortably? half of the day up and  doing things    How long can you walk comfortably? up and moving half of the day    Patient Stated Goals increase balance and strength    Currently in Pain? Yes    Pain Score 5     Pain Location Back    Pain Orientation Lower;Mid;Upper    Pain Descriptors / Indicators Aching    Pain Type Chronic pain    Pain Radiating Towards all the way along the spine    Aggravating Factors  slumping    Pain Relieving Factors tylenol and epidurals    Effect of Pain on Daily Activities can't do as much    Multiple Pain Sites No              [facility] PT Assessment - 09/16/19 0001      Assessment   Medical Diagnosis R26.9 (ICD-10-CM) - Unspecified abnormalities of gait and mobility    Referring Provider (PT) Lajean Manes, MD    Onset Date/Surgical Date --   insidious onset   Prior Therapy Yes a couple years ago for balance      Precautions   Precautions None      Balance Screen   Has the patient fallen in the past 6 months No  Home Environment   Living Environment Private residence    Living Arrangements Alone      Prior Function   Level of [place] Requirements home making; up and down stairs for activities      Cognition   Overall Cognitive Status Within Functional Limits for tasks assessed      Posture/Postural Control   Posture/Postural Control Postural limitations    Postural Limitations Rounded Shoulders;Decreased lumbar lordosis;Increased thoracic kyphosis;Flexed trunk;Weight shift left      ROM / Strength   AROM / PROM / Strength AROM;PROM;Strength      AROM   Overall AROM Comments WFL      PROM   Overall PROM Comments hip ER/IR 50%      Strength   Overall Strength Comments bilat hip abd/add 4-/5; Rt knee flex and ext 4/5      Flexibility   Soft Tissue Assessment /Muscle Length yes    Hamstrings 70%      Ambulation/Gait   Gait Pattern Trunk flexed;Trendelenburg;Decreased stride length      Standardized Balance Assessment    Standardized Balance Assessment Berg Balance Test;Five Times Sit to Stand    Five times sit to stand comments  24 sec - knee valgus      Berg Balance Test   Sit to Stand Able to stand without using hands and stabilize independently    Standing Unsupported Able to stand safely 2 minutes    Sitting with Back Unsupported but Feet Supported on Floor or Stool Able to sit safely and securely 2 minutes    Stand to Sit Sits safely with minimal use of hands    Transfers Able to transfer safely, minor use of hands    Standing Unsupported with Eyes Closed Able to stand 10 seconds safely    Standing Unsupported with Feet Together Able to place feet together independently and stand 1 minute safely    From Standing, Reach Forward with Outstretched Arm Can reach forward >12 cm safely (5")    From Standing Position, Pick up Object from Floor Able to pick up shoe safely and easily    From Standing Position, Turn to Look Behind Over each Shoulder Looks behind one side only/other side shows less weight shift    Turn 360 Degrees Able to turn 360 degrees safely one side only in 4 seconds or less    Standing Unsupported, Alternately Place Feet on Step/Stool Able to stand independently and safely and complete 8 steps in 20 seconds    Standing Unsupported, One Foot in Front Able to plae foot ahead of the other independently and hold 30 seconds   Rt leg trendelenburg   Standing on One Leg Able to lift leg independently and hold 5-10 seconds    Total Score 51                      Objective measurements completed on examination: See above findings.               PT Education - 09/16/19 0839    Education Details Access Code: [email or web address]    Person(s) Educated Patient    Methods Explanation;Demonstration;Verbal cues;Handout;Tactile cues    Comprehension Verbalized understanding;Returned demonstration            PT Short Term Goals - 09/16/19 0846      PT SHORT TERM GOAL #1   Title The  patient will have knowledge of initial HEP needed for balance and LE  strengthening    Time 4    Period Weeks    Status New    Target Date 10/14/19      PT SHORT TERM GOAL #2   Title improved hip abduction and adduciton to 4/5 MMT for improved gait    Time 4    Period Weeks    Status New    Target Date 12/09/19      PT SHORT TERM GOAL #3   Title 5 x sit to stand <18 sec    Time 4    Period Weeks    Status New    Target Date 12/09/19             PT Long Term Goals - 09/16/19 0843      PT LONG TERM GOAL #1   Title The patient will be independent in safe self progression of HEP for further improvements in balance    Time 12    Period Weeks    Status New    Target Date 12/09/19      PT LONG TERM GOAL #2   Title BERG balance score improved to 54/56 indicating decreased risk of falls    Time 12    Period Weeks    Status New    Target Date 12/09/19      PT LONG TERM GOAL #3   Title 5x sit to stand to <14 sec for improved balance and reduced risk of falls    Time 12    Period Weeks    Status New    Target Date 12/09/19      PT LONG TERM GOAL #4   Title LE strength grossly 4+/5 needed for safety with standing and walking    Baseline abduction and adduction 4-/5 MMT    Time 12    Period Weeks    Status New    Target Date 12/09/19      PT LONG TERM GOAL #5   Title Pt will report feeling at least 7/10 confidenct in her balance    Baseline 4/10-5/10    Time 12    Period Weeks    Status New    Target Date 12/09/19                  Plan - 09/16/19 0848    Clinical Impression Statement Pt presents to clinic due to feeling very unsteady on her feet.  Pt recently lost her husband and is now able to spend time getting herself some help fo this issue. Pt demonstrates increased time to perform 5x sit to stand and hip weakness with knee valgus.  Pt has Trendelenburg gait and slow walking speed. Rt >Lt trendelenburg and weakness LE Rt>Lt.  Pt will benefit from  skilled PT to address impairments and ensure maximum safety and function during daily activiites.    Personal Factors and Comorbidities Comorbidity 1    Comorbidities chronic low back pain    Examination-Activity Limitations Locomotion Level    Examination-Participation Restrictions Community Activity    Stability/Clinical Decision Making Stable/Uncomplicated    Clinical Decision Making Low    Rehab Potential Excellent    PT Frequency 2x / week    PT Duration 12 weeks    PT Treatment/Interventions ADLs/Self Care Home Management;Biofeedback;Moist Heat;Electrical Stimulation;Cryotherapy;Therapeutic activities;Therapeutic exercise;Gait training;Neuromuscular re-education;Patient/family education;Taping;Passive range of motion;Manual techniques;Dry needling    PT Next Visit Plan f/u on HEP and add LE strength and balance    PT Home Exercise Plan Access Code: [email or web address]  Consulted and Agree with Plan of Care Patient           Patient will benefit from skilled therapeutic intervention in order to improve the following deficits and impairments:  Decreased strength, Pain, Postural dysfunction, Decreased range of motion, Hypomobility, Abnormal gait, Decreased balance, Impaired flexibility  Visit Diagnosis: Other abnormalities of gait and mobility  Muscle weakness (generalized)     Problem List Patient Active Problem List   Diagnosis Date Noted  . Gastric ulcer 09/06/2018  . Anemia 09/06/2018  . Hypokalemia 09/05/2018  . HTN (hypertension) 09/05/2018  . Abnormal CT of the abdomen 09/05/2018    Jule Ser, PT 09/16/2019, 11:43 AM  [facility] [address] [place], [place], [address] Phone: [phone]   Fax:  [phone]  Name: Bria Sparr MRN: [id number] Date of Birth: 09-11-1932

## 2019-09-18 DIAGNOSIS — H52203 Unspecified astigmatism, bilateral: Secondary | ICD-10-CM | POA: Diagnosis not present

## 2019-09-18 DIAGNOSIS — H40013 Open angle with borderline findings, low risk, bilateral: Secondary | ICD-10-CM | POA: Diagnosis not present

## 2019-09-18 DIAGNOSIS — Z961 Presence of intraocular lens: Secondary | ICD-10-CM | POA: Diagnosis not present

## 2019-09-19 ENCOUNTER — Other Ambulatory Visit: Payer: Self-pay

## 2019-09-19 ENCOUNTER — Encounter: Payer: Medicare Other | Admitting: Physical Therapy

## 2019-09-19 ENCOUNTER — Ambulatory Visit: Payer: Medicare Other | Admitting: Physical Therapy

## 2019-09-19 ENCOUNTER — Encounter: Payer: Self-pay | Admitting: Physical Therapy

## 2019-09-19 DIAGNOSIS — M6281 Muscle weakness (generalized): Secondary | ICD-10-CM | POA: Diagnosis not present

## 2019-09-19 DIAGNOSIS — R2689 Other abnormalities of gait and mobility: Secondary | ICD-10-CM

## 2019-09-19 DIAGNOSIS — Z23 Encounter for immunization: Secondary | ICD-10-CM | POA: Diagnosis not present

## 2019-09-19 NOTE — Patient Instructions (Signed)
Access Code: [email or web address] URL: [email or web address] Date: 09/19/2019 Prepared by: Jari Favre  Exercises Clamshell - 1 x daily - 7 x weekly - 3 sets - 10 reps Seated Hamstring Stretch - 1 x daily - 7 x weekly - 3 reps - 1 sets - 30 sec hold Seated Hip Abduction - 1 x daily - 7 x weekly - 3 sets - 10 reps Standing Tandem Balance with Counter Support - 1 x daily - 7 x weekly - 3 sets - 10 reps Standing Marching - 1 x daily - 7 x weekly - 3 sets - 10 reps Standing Hip Abduction with Counter Support - 1 x daily - 7 x weekly - 3 sets - 10 reps Supine Bridge - 1 x daily - 7 x weekly - 3 sets - 10 reps Hooklying Clamshells with Resistance - 1 x daily - 7 x weekly - 3 sets - 10 reps Heel Raises with Unilateral Counter Support - 1 x daily - 7 x weekly - 3 sets - 10 reps

## 2019-09-19 NOTE — Therapy (Signed)
[facility] [address], [place], [place], [address] Phone: [phone]   Fax:  [phone]  Physical Therapy Treatment  Patient Details  Name: [name] MRN: [id number] Date of Birth: [birth year] Referring Provider (PT): Lajean Manes, MD   Encounter Date: 09/19/2019   PT End of Session - 09/19/19 1445    Visit Number 2    Date for PT Re-Evaluation 12/09/19    Authorization Type medicare    PT Start Time 1400    PT Stop Time 1440    PT Time Calculation (min) 40 min    Activity Tolerance Patient tolerated treatment well    Behavior During Therapy [facility] for tasks assessed/performed           Past Medical History:  Diagnosis Date  . Aortic valve sclerosis   . C. difficile colitis   . Hyperlipidemia   . Hypothyroid   . Mild recurrent major depression ([place])   . Osteopenia   . Pancreatic cancer [facility])    Pancreatic  . Vertigo   . Vitamin D deficiency     Past Surgical History:  Procedure Laterality Date  . BLADDER SUSPENSION  1999  . CATARACT EXTRACTION  2011   bilateral  . CHOLECYSTECTOMY  1992  . ESOPHAGOGASTRODUODENOSCOPY (EGD) WITH PROPOFOL N/A 09/06/2018   Procedure: ESOPHAGOGASTRODUODENOSCOPY (EGD) WITH PROPOFOL;  Surgeon: Wonda Horner, MD;  [facility];  Service: Endoscopy;  Laterality: N/A;  . lower back surgery  1994, 1995   L4-5  . multiple hernias  2007, 2012   ventral  . PARTIAL HYSTERECTOMY  1973  . SHOULDER ARTHROSCOPY  2011   rt  . TONSILLECTOMY  1942  . WHIPPLE PROCEDURE  2006    There were no vitals filed for this visit.   Subjective Assessment - 09/19/19 1403    Subjective I am ready to get started    Currently in Pain? Yes    Pain Score 8     Pain Location Back    Pain Orientation Lower    Pain Type Chronic pain    Pain Frequency Intermittent    Multiple Pain Sites No                             [facility] Adult PT Treatment/Exercise -  09/19/19 0001      Neuro Re-ed    Neuro Re-ed Details  standing with min UE support possible; head turns in tandem      Exercises   Exercises Knee/Hip      Knee/Hip Exercises: Standing   Heel Raises 20 reps    Hip Flexion 20 reps;Knee bent    Hip Abduction 10 reps;Right;Left      Knee/Hip Exercises: Seated   Clamshell with TheraBand Green   20 - light green loop   Sit to Sand 3 sets;10 reps;without UE support   gradual lowering the sitting surface     Knee/Hip Exercises: Supine   Bridges 10 reps    Bridges with Clamshell 10 reps                  PT Education - 09/19/19 1444    Education Details Access Code: [email or web address]    Person(s) Educated Patient    Methods Explanation;Demonstration;Verbal cues;Handout    Comprehension Verbalized understanding;Returned demonstration            PT Short Term Goals - 09/16/19 0947  PT SHORT TERM GOAL #1   Title The patient will have knowledge of initial HEP needed for balance and LE strengthening    Time 4    Period Weeks    Status New    Target Date 10/14/19      PT SHORT TERM GOAL #2   Title improved hip abduction and adduciton to 4/5 MMT for improved gait    Time 4    Period Weeks    Status New    Target Date 12/09/19      PT SHORT TERM GOAL #3   Title 5 x sit to stand <18 sec    Time 4    Period Weeks    Status New    Target Date 12/09/19             PT Long Term Goals - 09/16/19 0843      PT LONG TERM GOAL #1   Title The patient will be independent in safe self progression of HEP for further improvements in balance    Time 12    Period Weeks    Status New    Target Date 12/09/19      PT LONG TERM GOAL #2   Title BERG balance score improved to 54/56 indicating decreased risk of falls    Time 12    Period Weeks    Status New    Target Date 12/09/19      PT LONG TERM GOAL #3   Title 5x sit to stand to <14 sec for improved balance and reduced risk of falls    Time 12    Period Weeks    Status  New    Target Date 12/09/19      PT LONG TERM GOAL #4   Title LE strength grossly 4+/5 needed for safety with standing and walking    Baseline abduction and adduction 4-/5 MMT    Time 12    Period Weeks    Status New    Target Date 12/09/19      PT LONG TERM GOAL #5   Title Pt will report feeling at least 7/10 confidenct in her balance    Baseline 4/10-5/10    Time 12    Period Weeks    Status New    Target Date 12/09/19                 Plan - 09/19/19 1441    Clinical Impression Statement Pt did well with HEP updates.  Pt had a little more back pain today due to being busy but no increased pain with exercises  . Pt states she was feeling lightheaded because of the mask so PTmonitored symptoms throughout treatment.  Pt needs UE support and close supervision with standing exercises.  Pt will benefit from skilled PT to continue to progress strength and balance.    PT Treatment/Interventions ADLs/Self Care Home Management;Biofeedback;Moist Heat;Electrical Stimulation;Cryotherapy;Therapeutic activities;Therapeutic exercise;Gait training;Neuromuscular re-education;Patient/family education;Taping;Passive range of motion;Manual techniques;Dry needling    PT Next Visit Plan f/u on HEP and add LE strength and balance    PT Home Exercise Plan Access Code: [email or web address]    Consulted and Agree with Plan of Care Patient           Patient will benefit from skilled therapeutic intervention in order to improve the following deficits and impairments:  Decreased strength, Pain, Postural dysfunction, Decreased range of motion, Hypomobility, Abnormal gait, Decreased balance, Impaired flexibility  Visit Diagnosis: Other abnormalities of gait and mobility  Muscle weakness (generalized)     Problem List Patient Active Problem List   Diagnosis Date Noted  . Gastric ulcer 09/06/2018  . Anemia 09/06/2018  . Hypokalemia 09/05/2018  . HTN (hypertension) 09/05/2018  . Abnormal CT of the abdomen  09/05/2018    Jule Ser, PT 09/19/2019, 3:35 PM  [facility] [address], [place], [place], [address] Phone: [phone]   Fax:  [phone]  Name: [name] MRN: [id number] Date of Birth: [birth year]

## 2019-09-23 ENCOUNTER — Ambulatory Visit: Payer: Medicare Other

## 2019-09-23 ENCOUNTER — Other Ambulatory Visit: Payer: Self-pay

## 2019-09-23 DIAGNOSIS — M6281 Muscle weakness (generalized): Secondary | ICD-10-CM

## 2019-09-23 DIAGNOSIS — R2689 Other abnormalities of gait and mobility: Secondary | ICD-10-CM | POA: Diagnosis not present

## 2019-09-23 NOTE — Therapy (Signed)
[facility] [address], [place], [place], [address] Phone: [phone]   Fax:  [phone]  Physical Therapy Treatment  Patient Details  Name: [name] MRN: [id number] Date of Birth: 1932-06-28 Referring Provider (PT): Lajean Manes, MD   Encounter Date: 09/23/2019   PT End of Session - 09/23/19 1657    Visit Number 3    Date for PT Re-Evaluation 12/09/19    Authorization Type medicare    PT Start Time 2979    PT Stop Time 1700    PT Time Calculation (min) 43 min    Activity Tolerance Patient tolerated treatment well    Behavior During Therapy [facility] for tasks assessed/performed           Past Medical History:  Diagnosis Date  . Aortic valve sclerosis   . C. difficile colitis   . Hyperlipidemia   . Hypothyroid   . Mild recurrent major depression ([place])   . Osteopenia   . Pancreatic cancer [facility])    Pancreatic  . Vertigo   . Vitamin D deficiency     Past Surgical History:  Procedure Laterality Date  . BLADDER SUSPENSION  1999  . CATARACT EXTRACTION  2011   bilateral  . CHOLECYSTECTOMY  1992  . ESOPHAGOGASTRODUODENOSCOPY (EGD) WITH PROPOFOL N/A 09/06/2018   Procedure: ESOPHAGOGASTRODUODENOSCOPY (EGD) WITH PROPOFOL;  Surgeon: Wonda Horner, MD;  Location: [facility];  Service: Endoscopy;  Laterality: N/A;  . lower back surgery  1994, 1995   L4-5  . multiple hernias  2007, 2012   ventral  . PARTIAL HYSTERECTOMY  1973  . SHOULDER ARTHROSCOPY  2011   rt  . TONSILLECTOMY  1942  . WHIPPLE PROCEDURE  2006    There were no vitals filed for this visit.   Subjective Assessment - 09/23/19 1621    Subjective I have been doing my exercises and I need more.    Currently in Pain? Yes    Pain Score 4     Pain Location Back    Pain Orientation Lower    Pain Descriptors / Indicators Aching    Pain Type Chronic pain    Pain Onset More than a month ago    Pain Frequency Constant    Aggravating  Factors  activity    Pain Relieving Factors heat                             [facility] Adult PT Treatment/Exercise - 09/23/19 0001      Knee/Hip Exercises: Aerobic   Nustep Level 2x 8 minutes       Knee/Hip Exercises: Standing   Hip Abduction Right;Left;20 reps    Hip Extension Stengthening;Both;20 reps    Rocker Board 3 minutes    Rebounder weight shifting 3 ways x1 min each    Other Standing Knee Exercises standing on black pad: feet together with turning body Rt and Lt.  Alternating taps on edge of  treadmill when standing on balance pad 2x10 bil each       Knee/Hip Exercises: Seated   Sit to Sand 10 reps;without UE support;2 sets   gradual lowering the sitting surface                 PT Education - 09/23/19 1636    Education Details Access Code: [email or web address]    Person(s) Educated Patient    Methods Explanation;Demonstration;Handout    Comprehension Verbalized understanding;Returned demonstration  PT Short Term Goals - 09/16/19 0846      PT SHORT TERM GOAL #1   Title The patient will have knowledge of initial HEP needed for balance and LE strengthening    Time 4    Period Weeks    Status New    Target Date 10/14/19      PT SHORT TERM GOAL #2   Title improved hip abduction and adduciton to 4/5 MMT for improved gait    Time 4    Period Weeks    Status New    Target Date 12/09/19      PT SHORT TERM GOAL #3   Title 5 x sit to stand <18 sec    Time 4    Period Weeks    Status New    Target Date 12/09/19             PT Long Term Goals - 09/16/19 0843      PT LONG TERM GOAL #1   Title The patient will be independent in safe self progression of HEP for further improvements in balance    Time 12    Period Weeks    Status New    Target Date 12/09/19      PT LONG TERM GOAL #2   Title BERG balance score improved to 54/56 indicating decreased risk of falls    Time 12    Period Weeks    Status New    Target Date 12/09/19        PT LONG TERM GOAL #3   Title 5x sit to stand to <14 sec for improved balance and reduced risk of falls    Time 12    Period Weeks    Status New    Target Date 12/09/19      PT LONG TERM GOAL #4   Title LE strength grossly 4+/5 needed for safety with standing and walking    Baseline abduction and adduction 4-/5 MMT    Time 12    Period Weeks    Status New    Target Date 12/09/19      PT LONG TERM GOAL #5   Title Pt will report feeling at least 7/10 confidenct in her balance    Baseline 4/10-5/10    Time 12    Period Weeks    Status New    Target Date 12/09/19                 Plan - 09/23/19 1640    Clinical Impression Statement Pt arrived asking for more exercises to work on at home.  PT added standing hip strength and sit to stand with emphasis on posture, alignment and activation of core.  Pt required supervision and verbal cues for standing exercises and closer guarding for balance exercises. Pt with posteriof pelvic tilt and trunk extension in standing and required tactile and verbal cues for alignment.  Pt will continue to benefit from skilled PT address balance, strength and endurance to improve safety.    PT Frequency 2x / week    PT Duration 12 weeks    PT Treatment/Interventions ADLs/Self Care Home Management;Biofeedback;Moist Heat;Electrical Stimulation;Cryotherapy;Therapeutic activities;Therapeutic exercise;Gait training;Neuromuscular re-education;Patient/family education;Taping;Passive range of motion;Manual techniques;Dry needling    PT Next Visit Plan f/u on HEP and add LE strength and balance,    PT Home Exercise Plan Access Code: [email or web address]    Recommended Other Services initial cert is signed    Consulted and Agree with Plan of Care Patient  Patient will benefit from skilled therapeutic intervention in order to improve the following deficits and impairments:  Decreased strength, Pain, Postural dysfunction, Decreased range of motion, Hypomobility,  Abnormal gait, Decreased balance, Impaired flexibility  Visit Diagnosis: Other abnormalities of gait and mobility  Muscle weakness (generalized)     Problem List Patient Active Problem List   Diagnosis Date Noted  . Gastric ulcer 09/06/2018  . Anemia 09/06/2018  . Hypokalemia 09/05/2018  . HTN (hypertension) 09/05/2018  . Abnormal CT of the abdomen 09/05/2018    Sigurd Sos, PT 09/23/19 5:03 PM  [facility] [address] [place], [place], [address] Phone: [phone]   Fax:  [phone]  Name: Leler Brion MRN: [id number] Date of Birth: 09-02-1932

## 2019-09-23 NOTE — Patient Instructions (Signed)
Access Code: [email or web address] URL: [email or web address] Date: 09/23/2019 Prepared by: Claiborne Billings  Exercises   Sit to Stand - 2 x daily - 7 x weekly - 2 sets - 10 reps Standing Hip Abduction with Counter Support - 2 x daily - 7 x weekly - 2 sets - 10 reps Standing Hip Extension with Counter Support - 2 x daily - 7 x weekly - 2 sets - 10 reps

## 2019-09-24 ENCOUNTER — Encounter: Payer: Self-pay | Admitting: Podiatrist

## 2019-09-24 ENCOUNTER — Ambulatory Visit (INDEPENDENT_AMBULATORY_CARE_PROVIDER_SITE_OTHER): Payer: Medicare Other | Admitting: Podiatrist

## 2019-09-24 DIAGNOSIS — L089 Local infection of the skin and subcutaneous tissue, unspecified: Secondary | ICD-10-CM

## 2019-09-24 DIAGNOSIS — M2012 Hallux valgus (acquired), left foot: Secondary | ICD-10-CM

## 2019-09-24 DIAGNOSIS — M2041 Other hammer toe(s) (acquired), right foot: Secondary | ICD-10-CM

## 2019-09-24 DIAGNOSIS — L84 Corns and callosities: Secondary | ICD-10-CM | POA: Diagnosis not present

## 2019-09-24 DIAGNOSIS — M2042 Other hammer toe(s) (acquired), left foot: Secondary | ICD-10-CM | POA: Diagnosis not present

## 2019-09-24 DIAGNOSIS — M2011 Hallux valgus (acquired), right foot: Secondary | ICD-10-CM

## 2019-09-24 NOTE — Patient Instructions (Signed)
Check [employer]  for Bunion splints-- they are good to wear at night to help pull the bunion away from the toes.  Bunion sleeves are good for the day as they protect the bunion prominence Foam spacers are also good to wear between the toes that rub together .   Toe exercises for the left 2nd toe-- get a towel and bunch it up with your toe strength  Get some marbles or small glass beads and transfer them from one location to another- you may sit doing these exercises.

## 2019-09-26 ENCOUNTER — Other Ambulatory Visit: Payer: Self-pay

## 2019-09-26 ENCOUNTER — Ambulatory Visit: Payer: Medicare Other | Admitting: Physical Therapy

## 2019-09-26 ENCOUNTER — Encounter: Payer: Self-pay | Admitting: Physical Therapy

## 2019-09-26 DIAGNOSIS — M6281 Muscle weakness (generalized): Secondary | ICD-10-CM | POA: Diagnosis not present

## 2019-09-26 DIAGNOSIS — R2689 Other abnormalities of gait and mobility: Secondary | ICD-10-CM

## 2019-09-26 NOTE — Therapy (Signed)
[facility] [address] [place], [place], [address] Phone: [phone]   Fax:  [phone]  Physical Therapy Treatment  Patient Details  Name: [name] MRN: [id number] Date of Birth: Jan 21, 1933 Referring Provider (PT): Lajean Manes, MD   Encounter Date: 09/26/2019   PT End of Session - 09/26/19 1457    Visit Number 4    Date for PT Re-Evaluation 12/09/19    Authorization Type medicare    PT Start Time 1445    PT Stop Time 1525    PT Time Calculation (min) 40 min    Activity Tolerance Patient tolerated treatment well    Behavior During Therapy [facility] for tasks assessed/performed           Past Medical History:  Diagnosis Date  . Aortic valve sclerosis   . C. difficile colitis   . Hyperlipidemia   . Hypothyroid   . Mild recurrent major depression ([place])   . Osteopenia   . Pancreatic cancer [facility])    Pancreatic  . Vertigo   . Vitamin D deficiency     Past Surgical History:  Procedure Laterality Date  . BLADDER SUSPENSION  1999  . CATARACT EXTRACTION  2011   bilateral  . CHOLECYSTECTOMY  1992  . ESOPHAGOGASTRODUODENOSCOPY (EGD) WITH PROPOFOL N/A 09/06/2018   Procedure: ESOPHAGOGASTRODUODENOSCOPY (EGD) WITH PROPOFOL;  Surgeon: Wonda Horner, MD;  [facility];  Service: Endoscopy;  Laterality: N/A;  . lower back surgery  1994, 1995   L4-5  . multiple hernias  2007, 2012   ventral  . PARTIAL HYSTERECTOMY  1973  . SHOULDER ARTHROSCOPY  2011   rt  . TONSILLECTOMY  1942  . WHIPPLE PROCEDURE  2006    There were no vitals filed for this visit.   Subjective Assessment - 09/26/19 1510    Subjective I feel like my balance is already getting better    Patient Stated Goals increase balance and strength    Currently in Pain? No/denies              [facility] PT Assessment - 09/26/19 0001      Assessment   Medical Diagnosis s                         [facility] Adult PT  Treatment/Exercise - 09/26/19 0001      Knee/Hip Exercises: Aerobic   Nustep Level 2-4x 10 minutes       Knee/Hip Exercises: Standing   Hip Abduction Right;Left;20 reps    Abduction Limitations red band    Hip Extension Stengthening;Both;20 reps    Rocker Board 3 minutes    Rebounder weight shifting 3 ways x1 min each    Other Standing Knee Exercises standing on black pad: feet together with turning body Rt and Lt.  Alternating taps on edge of  treadmill when standing on balance pad 2x10 bil each       Knee/Hip Exercises: Seated   Other Seated Knee/Hip Exercises thoracic extension into ball - red band horizontal abduction - to work on core strength for improved standing posture and stability    Sit to Sand --   gradual lowering the sitting surface                   PT Short Term Goals - 09/26/19 1514      PT SHORT TERM GOAL #1   Title The patient will have knowledge of initial HEP  needed for balance and LE strengthening    Status Achieved             PT Long Term Goals - 09/16/19 0843      PT LONG TERM GOAL #1   Title The patient will be independent in safe self progression of HEP for further improvements in balance    Time 12    Period Weeks    Status New    Target Date 12/09/19      PT LONG TERM GOAL #2   Title BERG balance score improved to 54/56 indicating decreased risk of falls    Time 12    Period Weeks    Status New    Target Date 12/09/19      PT LONG TERM GOAL #3   Title 5x sit to stand to <14 sec for improved balance and reduced risk of falls    Time 12    Period Weeks    Status New    Target Date 12/09/19      PT LONG TERM GOAL #4   Title LE strength grossly 4+/5 needed for safety with standing and walking    Baseline abduction and adduction 4-/5 MMT    Time 12    Period Weeks    Status New    Target Date 12/09/19      PT LONG TERM GOAL #5   Title Pt will report feeling at least 7/10 confidenct in her balance    Baseline 4/10-5/10      Time 12    Period Weeks    Status New    Target Date 12/09/19                 Plan - 09/26/19 1533    Clinical Impression Statement Pt reports she is doing well with exercises and she is feeling more stable.  Added band to standing hip and able to march when standing on the foam mat.  Pt needed cues to improve posutre with more throracic extension.  Pt will benefit from skilled PT to continue to progress strength and balance    PT Treatment/Interventions ADLs/Self Care Home Management;Biofeedback;Moist Heat;Electrical Stimulation;Cryotherapy;Therapeutic activities;Therapeutic exercise;Gait training;Neuromuscular re-education;Patient/family education;Taping;Passive range of motion;Manual techniques;Dry needling    PT Next Visit Plan 5xsit to stand    PT Home Exercise Plan Access Code: [email or web address]    Consulted and Agree with Plan of Care Patient           Patient will benefit from skilled therapeutic intervention in order to improve the following deficits and impairments:  Decreased strength, Pain, Postural dysfunction, Decreased range of motion, Hypomobility, Abnormal gait, Decreased balance, Impaired flexibility  Visit Diagnosis: Other abnormalities of gait and mobility  Muscle weakness (generalized)     Problem List Patient Active Problem List   Diagnosis Date Noted  . Gastric ulcer 09/06/2018  . Anemia 09/06/2018  . Hypokalemia 09/05/2018  . HTN (hypertension) 09/05/2018  . Abnormal CT of the abdomen 09/05/2018    Jule Ser, PT 09/26/2019, 3:41 PM  [facility] [address] [place], [place], [address] Phone: [phone]   Fax:  [phone]  Name: [name] MRN: [id number] Date of Birth: 1932/08/15

## 2019-09-26 NOTE — Progress Notes (Signed)
CC:  Patient presents for corns and calluses.  She relates she wants to wear open toed shoes as they are more comfortable but she is having balance issues which force her to wear tennis shoes.  She is scheduled for physical therapy to help with gait and balance.     HPI: Patient is 86 y.o. female who presents today for the concerns as listed above. She relates pain in multiple areas of her feet due to the corns and calluses she also states she just lost her husband 2 weeks ago and in taking care of him she has neglected her own health.    Patient Active Problem List   Diagnosis Date Noted  . Gastric ulcer 09/06/2018  . Anemia 09/06/2018  . Hypokalemia 09/05/2018  . HTN (hypertension) 09/05/2018  . Abnormal CT of the abdomen 09/05/2018    Current Outpatient Medications on File Prior to Visit  Medication Sig Dispense Refill  . Ascorbic Acid (VITAMIN C) 1000 MG tablet Take 1,000 mg by mouth daily.    . cholestyramine (QUESTRAN) 4 GM/DOSE powder Take 4 g by mouth 2 (two) times daily as needed (diarrhea).     . diclofenac sodium (VOLTAREN) 1 % GEL Apply 2 g topically daily as needed (pain).     . Glycerin-Hypromellose-PEG 400 (DRY EYE RELIEF DROPS) 0.2-0.2-1 % SOLN Apply 1 drop to eye 2 (two) times daily.    . Iron, Ferrous Sulfate, 325 (65 Fe) MG TABS Take 325 mg by mouth daily. 30 tablet 0  . lisinopril (ZESTRIL) 10 MG tablet Take 10 mg by mouth daily.    . Multiple Vitamins-Minerals (COMPLETE MULTIVITAMIN/MINERAL PO) Take 1 tablet by mouth daily.    . pantoprazole (PROTONIX) 40 MG tablet Take 1 tablet (40 mg total) by mouth 2 (two) times daily. 30 tablet 1  . Probiotic Product (PROBIOTIC DAILY PO) Take 1 capsule by mouth daily.     . sertraline (ZOLOFT) 50 MG tablet Take 25 mg by mouth at bedtime.    . Vitamin D, Ergocalciferol, (DRISDOL) 50000 UNITS CAPS Take 50,000 Units by mouth 2 (two) times a week. Every Wednesday and Saturday       No current facility-administered medications on  file prior to visit.    Allergies  Allergen Reactions  . Ivp Dye [Iodinated Diagnostic Agents] Anaphylaxis    Pet scan Gastrografin per patient Irven Baltimore, RN  . Amoxicillin Nausea Only and Other (See Comments)    "Knocks her flat" per pt Did it involve swelling of the face/tongue/throat, SOB, or low BP? No Did it involve sudden or severe rash/hives, skin peeling, or any reaction on the inside of your mouth or nose? No Did you need to seek medical attention at a hospital or doctor's office? No When did it last happen?Over 10 years ago If all above answers are "NO", may proceed with cephalosporin use.  . Ciprofloxacin Nausea And Vomiting  . Dextromethorphan Polistirex Er Nausea And Vomiting  . Diatrizoate Meglumine & Sodium Other (See Comments)    Unknown rxn  . Flagyl [Metronidazole] Nausea And Vomiting  . Hydrocodone Nausea And Vomiting  . Methocarbamol Nausea And Vomiting  . Oxycodone Nausea And Vomiting  . Prednisone Nausea And Vomiting    Able to tolerate low doses  . Promethazine Hcl Nausea And Vomiting  . Sucralfate Diarrhea, Nausea Only and Other (See Comments)    Stomach pain and dizziness  . Tape Other (See Comments)    Skin tearing and irritation    Review of  Systems No fevers, chills, nausea, muscle aches, no difficulty breathing, no calf pain, no chest pain or shortness of breath.   Physical Exam  GENERAL APPEARANCE: Alert, conversant. Appropriately groomed. No acute distress.   VASCULAR: Pedal pulses palpable DP and PT bilateral.  Capillary refill time is immediate to all digits,  Proximal to distal cooling it warm to cool.     NEUROLOGIC: sensation is intact epicritically and protectively to 5.07 monofilament at 5/5 sites bilateral.  Light touch is intact bilateral, vibratory sensation intact bilateral, achilles tendon reflex is intact bilateral.   MUSCULOSKELETAL: acceptable muscle strength, tone and stability bilateral.  Bunion deformities and hammertoe  contracture present bilaterally.  Adductovarus of the right fourth toe is noted with inflammation at the distal ipj laterally. Tailors bunion present right greater than left.  No pain, crepitus or limitation noted with foot and ankle range of motion bilateral.   DERMATOLOGIC: skin is warm, supple, and dry. Fat pad atrophy noted.  Purplish discoloration present over the first and fifth metatarsal heads bilateral from shoe rubbing in this area.   Hyperkeratotic lesions present in the following locations:    -Medial aspect of bilateral hallux (2) -submetatarsal 1 right -right lateral fourth digit -submetatarsal 5 bilateral. (2)  Assessment     ICD-10-CM   1. Callus of foot  L84   2. Acquired hallux valgus of both feet  M20.11    M20.12   3. Hammertoe of right foot  M20.41   4. Hammertoe of left foot  M20.42   5. Inflammation of toe  L08.9      Plan  -Discussed treatment options.  At this time conservative therapies recommended.  I pared the calluses x 6 down with a 15 blade without complication. -I injected the right fourth toe at the area of inflammation laterally with 2.5mg  kenalog and 41ml marcaine plain under sterile technique.   -I applied a toe cap to the right fourth toe and recommended foam toe spacers -I applied a arch cookie in her sandals in hopes this will help fill some of the arch and decrease the friction in her shoes. -recommended continued routine care for the calluses in 2-3 months or as needed. - will discuss orthotics at that time if arch cookies were beneficial- she has spoken with rick about orthotics in the past and states she has some that were made at [facility] so she may or may not be interested as this has been mentioned at a past visit with Dr. Jacqualyn Posey.

## 2019-09-27 ENCOUNTER — Encounter: Payer: Medicare Other | Admitting: Physical Therapy

## 2019-09-27 DIAGNOSIS — Z124 Encounter for screening for malignant neoplasm of cervix: Secondary | ICD-10-CM | POA: Diagnosis not present

## 2019-09-27 DIAGNOSIS — Z1231 Encounter for screening mammogram for malignant neoplasm of breast: Secondary | ICD-10-CM | POA: Diagnosis not present

## 2019-09-30 ENCOUNTER — Other Ambulatory Visit: Payer: Self-pay

## 2019-09-30 ENCOUNTER — Ambulatory Visit: Payer: Medicare Other | Admitting: Physical Therapy

## 2019-09-30 DIAGNOSIS — R2689 Other abnormalities of gait and mobility: Secondary | ICD-10-CM | POA: Diagnosis not present

## 2019-09-30 DIAGNOSIS — M6281 Muscle weakness (generalized): Secondary | ICD-10-CM

## 2019-09-30 NOTE — Therapy (Signed)
[facility] [address] [place], [place], [address] Phone: [phone]   Fax:  [phone]  Physical Therapy Treatment  Patient Details  Name: [name] MRN: [id number] Date of Birth: 1932/10/14 Referring Provider (PT): Lajean Manes, MD   Encounter Date: 09/30/2019   PT End of Session - 09/30/19 1443    Visit Number 5    Date for PT Re-Evaluation 12/09/19    Authorization Type medicare    PT Start Time 0800    PT Stop Time 0841    PT Time Calculation (min) 41 min    Activity Tolerance Patient tolerated treatment well    Behavior During Therapy [facility] for tasks assessed/performed           Past Medical History:  Diagnosis Date  . Aortic valve sclerosis   . C. difficile colitis   . Hyperlipidemia   . Hypothyroid   . Mild recurrent major depression ([place])   . Osteopenia   . Pancreatic cancer [facility])    Pancreatic  . Vertigo   . Vitamin D deficiency     Past Surgical History:  Procedure Laterality Date  . BLADDER SUSPENSION  1999  . CATARACT EXTRACTION  2011   bilateral  . CHOLECYSTECTOMY  1992  . ESOPHAGOGASTRODUODENOSCOPY (EGD) WITH PROPOFOL N/A 09/06/2018   Procedure: ESOPHAGOGASTRODUODENOSCOPY (EGD) WITH PROPOFOL;  Surgeon: Wonda Horner, MD;  [facility];  Service: Endoscopy;  Laterality: N/A;  . lower back surgery  1994, 1995   L4-5  . multiple hernias  2007, 2012   ventral  . PARTIAL HYSTERECTOMY  1973  . SHOULDER ARTHROSCOPY  2011   rt  . TONSILLECTOMY  1942  . WHIPPLE PROCEDURE  2006    There were no vitals filed for this visit.   Subjective Assessment - 09/30/19 0808    Subjective I have to use my hands and it grounds me.    Currently in Pain? No/denies                             [facility] Adult PT Treatment/Exercise - 09/30/19 0001      Knee/Hip Exercises: Stretches   Other Knee/Hip Stretches pec stretch in the doorway      Knee/Hip Exercises:  Aerobic   Nustep Level 3x 5 minutes       Knee/Hip Exercises: Standing   Hip Flexion 20 reps;Knee bent    Hip Flexion Limitations no UE support - close sup/CGA    Hip Abduction Right;Left;10 reps    Abduction Limitations no UE support on level surface/ close sup/CGA    Rocker Board 3 minutes    Rebounder weight shifting 3 ways x1 min each    Other Standing Knee Exercises standing on black pad shoulder ext with yellow - 15x      Knee/Hip Exercises: Seated   Other Seated Knee/Hip Exercises thoracic extension into ball - red band horizontal abduction - to work on core strength for improved standing posture and stability    Sit to Sand --   gradual lowering the sitting surface                   PT Short Term Goals - 09/26/19 1514      PT SHORT TERM GOAL #1   Title The patient will have knowledge of initial HEP needed for balance and LE strengthening    Status Achieved  PT Long Term Goals - 09/16/19 0843      PT LONG TERM GOAL #1   Title The patient will be independent in safe self progression of HEP for further improvements in balance    Time 12    Period Weeks    Status New    Target Date 12/09/19      PT LONG TERM GOAL #2   Title BERG balance score improved to 54/56 indicating decreased risk of falls    Time 12    Period Weeks    Status New    Target Date 12/09/19      PT LONG TERM GOAL #3   Title 5x sit to stand to <14 sec for improved balance and reduced risk of falls    Time 12    Period Weeks    Status New    Target Date 12/09/19      PT LONG TERM GOAL #4   Title LE strength grossly 4+/5 needed for safety with standing and walking    Baseline abduction and adduction 4-/5 MMT    Time 12    Period Weeks    Status New    Target Date 12/09/19      PT LONG TERM GOAL #5   Title Pt will report feeling at least 7/10 confidenct in her balance    Baseline 4/10-5/10    Time 12    Period Weeks    Status New    Target Date 12/09/19                  Plan - 09/30/19 0839    Clinical Impression Statement Pt was able to do exercises with less support today.  She was hesitant to let go but once she realized she didn't need to use her hands as much she was more confident.  She is encouraged to stand near the counter with her home exercises but not hold as much as possible.    PT Treatment/Interventions ADLs/Self Care Home Management;Biofeedback;Moist Heat;Electrical Stimulation;Cryotherapy;Therapeutic activities;Therapeutic exercise;Gait training;Neuromuscular re-education;Patient/family education;Taping;Passive range of motion;Manual techniques;Dry needling    PT Next Visit Plan 5xsit to stand    PT Home Exercise Plan Access Code: [email or web address]    Consulted and Agree with Plan of Care Patient           Patient will benefit from skilled therapeutic intervention in order to improve the following deficits and impairments:  Decreased strength, Pain, Postural dysfunction, Decreased range of motion, Hypomobility, Abnormal gait, Decreased balance, Impaired flexibility  Visit Diagnosis: Other abnormalities of gait and mobility  Muscle weakness (generalized)     Problem List Patient Active Problem List   Diagnosis Date Noted  . Gastric ulcer 09/06/2018  . Anemia 09/06/2018  . Hypokalemia 09/05/2018  . HTN (hypertension) 09/05/2018  . Abnormal CT of the abdomen 09/05/2018    Jule Ser, PT 09/30/2019, 8:42 AM  [facility] [address], [place], [place], [address] Phone: [phone]   Fax:  [phone]  Name: [name] MRN: [id number] Date of Birth: Apr 20, 1932

## 2019-10-02 ENCOUNTER — Other Ambulatory Visit: Payer: Self-pay

## 2019-10-02 ENCOUNTER — Ambulatory Visit: Payer: Medicare Other | Attending: Geriatric Medicine | Admitting: Physical Therapy

## 2019-10-02 DIAGNOSIS — R2689 Other abnormalities of gait and mobility: Secondary | ICD-10-CM

## 2019-10-02 DIAGNOSIS — M6281 Muscle weakness (generalized): Secondary | ICD-10-CM

## 2019-10-02 NOTE — Therapy (Signed)
[facility] [address] [place], [place], [address] Phone: [phone]   Fax:  [phone]  Physical Therapy Treatment  Patient Details  Name: [name] MRN: [id number] Date of Birth: Jun 07, 1932 Referring Provider (PT): Lajean Manes, MD   Encounter Date: 10/02/2019   PT End of Session - 10/02/19 1401    Visit Number 6    Date for PT Re-Evaluation 12/09/19    Authorization Type medicare    PT Start Time 1401    PT Stop Time 1440    PT Time Calculation (min) 39 min    Activity Tolerance Patient tolerated treatment well    Behavior During Therapy [facility] for tasks assessed/performed           Past Medical History:  Diagnosis Date  . Aortic valve sclerosis   . C. difficile colitis   . Hyperlipidemia   . Hypothyroid   . Mild recurrent major depression ([place])   . Osteopenia   . Pancreatic cancer [facility])    Pancreatic  . Vertigo   . Vitamin D deficiency     Past Surgical History:  Procedure Laterality Date  . BLADDER SUSPENSION  1999  . CATARACT EXTRACTION  2011   bilateral  . CHOLECYSTECTOMY  1992  . ESOPHAGOGASTRODUODENOSCOPY (EGD) WITH PROPOFOL N/A 09/06/2018   Procedure: ESOPHAGOGASTRODUODENOSCOPY (EGD) WITH PROPOFOL;  Surgeon: Wonda Horner, MD;  Location: [facility];  Service: Endoscopy;  Laterality: N/A;  . lower back surgery  1994, 1995   L4-5  . multiple hernias  2007, 2012   ventral  . PARTIAL HYSTERECTOMY  1973  . SHOULDER ARTHROSCOPY  2011   rt  . TONSILLECTOMY  1942  . WHIPPLE PROCEDURE  2006    There were no vitals filed for this visit.   Subjective Assessment - 10/02/19 1406    Subjective "I'm so proud of myself, I'm feeling better".  Pt reports she has been doing her exercises and is seeing a difference, has more energy.    Pertinent History husband passed July of this year    Patient Stated Goals increase balance and strength    Currently in Pain? Yes    Pain Score 5     states this is a little more than baseline   Pain Location Back    Pain Orientation Upper    Pain Descriptors / Indicators Aching    Aggravating Factors  office work, routine activities    Pain Relieving Factors heat              [facility] PT Assessment - 10/02/19 0001      Standardized Balance Assessment   Five times sit to stand comments  15.52 sec             [facility] Adult PT Treatment/Exercise - 10/02/19 0001      Knee/Hip Exercises: Stretches   Passive Hamstring Stretch Right;Left;2 reps;20 seconds    Other Knee/Hip Stretches middle doorway pec stretch x 20 sec x 2 reps     Other Knee/Hip Stretches thoracic ext over back of chair with hands supporting head x 5 reps       Knee/Hip Exercises: Aerobic   Nustep Level 3x 5 minutes  (arms/legs)      Knee/Hip Exercises: Standing   Heel Raises Both;1 set;10 reps   and toe raises     Knee/Hip Exercises: Seated   Sit to Sand 2 sets;5 reps;without UE support  Balance Exercises - 10/02/19 0001      Balance Exercises: Standing   Standing Eyes Closed Narrow base of support (BOS);Solid surface;2 reps;10 secs    Tandem Stance Eyes open;1 rep;15 secs    SLS Eyes open;Solid surface;Foam/compliant surface;Intermittent upper extremity support;2 reps;15 secs   2 sets   Wall Bumps Eyes opened;Hip;10 reps    Gait with Head Turns Forward;2 reps;Limitations    Gait with Head Turns Limitations unsteady with both vertical and horiz head turns.     Tandem Gait Forward;2 reps    Retro Gait 1 rep    Other Standing Exercises toe taps to 5" surface without UE support, alternating feet without UE support x 20               PT Short Term Goals - 09/26/19 1514      PT SHORT TERM GOAL #1   Title The patient will have knowledge of initial HEP needed for balance and LE strengthening    Status Achieved             PT Long Term Goals - 10/02/19 1441      PT LONG TERM GOAL #1   Title The patient will be independent in safe  self progression of HEP for further improvements in balance    Time 12    Period Weeks    Status On-going      PT LONG TERM GOAL #2   Title BERG balance score improved to 54/56 indicating decreased risk of falls    Time 12    Period Weeks    Status On-going      PT LONG TERM GOAL #3   Title 5x sit to stand to <14 sec for improved balance and reduced risk of falls    Time 12    Period Weeks    Status On-going      PT LONG TERM GOAL #4   Title LE strength grossly 4+/5 needed for safety with standing and walking    Baseline abduction and adduction 4-/5 MMT    Time 12    Period Weeks    Status On-going      PT LONG TERM GOAL #5   Title Pt will report feeling at least 7/10 confidenct in her balance    Baseline 4/10-5/10    Time 12    Period Weeks    Status On-going                 Plan - 10/02/19 1441    Clinical Impression Statement Improved sit to stand time; 15.52 sec improved from 24 sec.  Pt was challenged with balance exercises with narrow base of support, and head turns; may benefit from further exercises with these components.  Pt making good progress towards goals.    PT Treatment/Interventions ADLs/Self Care Home Management;Biofeedback;Moist Heat;Electrical Stimulation;Cryotherapy;Therapeutic activities;Therapeutic exercise;Gait training;Neuromuscular re-education;Patient/family education;Taping;Passive range of motion;Manual techniques;Dry needling    PT Next Visit Plan 5xsit to stand    PT Home Exercise Plan Access Code: [email or web address]    Consulted and Agree with Plan of Care Patient           Patient will benefit from skilled therapeutic intervention in order to improve the following deficits and impairments:  Decreased strength, Pain, Postural dysfunction, Decreased range of motion, Hypomobility, Abnormal gait, Decreased balance, Impaired flexibility  Visit Diagnosis: Other abnormalities of gait and mobility  Muscle weakness (generalized)     Problem  List Patient Active Problem List   Diagnosis  Date Noted  . Gastric ulcer 09/06/2018  . Anemia 09/06/2018  . Hypokalemia 09/05/2018  . HTN (hypertension) 09/05/2018  . Abnormal CT of the abdomen 09/05/2018    Shelbie Hutching 10/02/2019, 2:47 PM  [facility] [address], [place], [place], [address] Phone: [phone]   Fax:  [phone]  Name: Rosellen Lichtenberger MRN: [id number] Date of Birth: 02-Feb-1932

## 2019-10-09 ENCOUNTER — Encounter: Payer: Self-pay | Admitting: Physical Therapy

## 2019-10-09 ENCOUNTER — Other Ambulatory Visit: Payer: Self-pay

## 2019-10-09 ENCOUNTER — Ambulatory Visit: Payer: Medicare Other | Admitting: Physical Therapy

## 2019-10-09 DIAGNOSIS — M6281 Muscle weakness (generalized): Secondary | ICD-10-CM | POA: Diagnosis not present

## 2019-10-09 DIAGNOSIS — R2689 Other abnormalities of gait and mobility: Secondary | ICD-10-CM

## 2019-10-09 NOTE — Therapy (Signed)
[facility] [address] [place], [place], [address] Phone: [phone]   Fax:  [phone]  Physical Therapy Treatment  Patient Details  Name: [name] MRN: [id number] Date of Birth: April 16, 1932 Referring Provider (PT): Lajean Manes, MD   Encounter Date: 10/09/2019   PT End of Session - 10/09/19 1101    Visit Number 7    Date for PT Re-Evaluation 12/09/19    Authorization Type medicare    PT Start Time 1101    PT Stop Time 1144    PT Time Calculation (min) 43 min    Activity Tolerance Patient tolerated treatment well    Behavior During Therapy [facility] for tasks assessed/performed           Past Medical History:  Diagnosis Date  . Aortic valve sclerosis   . C. difficile colitis   . Hyperlipidemia   . Hypothyroid   . Mild recurrent major depression ([place])   . Osteopenia   . Pancreatic cancer [facility])    Pancreatic  . Vertigo   . Vitamin D deficiency     Past Surgical History:  Procedure Laterality Date  . BLADDER SUSPENSION  1999  . CATARACT EXTRACTION  2011   bilateral  . CHOLECYSTECTOMY  1992  . ESOPHAGOGASTRODUODENOSCOPY (EGD) WITH PROPOFOL N/A 09/06/2018   Procedure: ESOPHAGOGASTRODUODENOSCOPY (EGD) WITH PROPOFOL;  Surgeon: Wonda Horner, MD;  [facility];  Service: Endoscopy;  Laterality: N/A;  . lower back surgery  1994, 1995   L4-5  . multiple hernias  2007, 2012   ventral  . PARTIAL HYSTERECTOMY  1973  . SHOULDER ARTHROSCOPY  2011   rt  . TONSILLECTOMY  1942  . WHIPPLE PROCEDURE  2006    There were no vitals filed for this visit.   Subjective Assessment - 10/09/19 1313    Subjective Patient reports she is working hard and doing better.    Pertinent History husband passed July of this year    How long can you stand comfortably? half of the day up and doing things    How long can you walk comfortably? up and moving half of the day    Patient Stated Goals increase balance  and strength    Currently in Pain? Yes    Pain Score 5     Pain Location Back    Pain Orientation Upper    Pain Descriptors / Indicators Aching                             [facility] Adult PT Treatment/Exercise - 10/09/19 0001      Knee/Hip Exercises: Stretches   Gastroc Stretch Right;Left;30 seconds      Knee/Hip Exercises: Standing   Heel Raises Both;1 set;10 reps   and toe raises   Hip Abduction 15 reps;Both    Lateral Step Up Left;15 reps      Knee/Hip Exercises: Seated   Sit to Sand 10 reps   cues to keep knees aBDucted              Balance Exercises - 10/09/19 0001      Balance Exercises: Standing   Standing Eyes Closed Narrow base of support (BOS);Solid surface;2 reps;10 secs   eyes closed   Tandem Stance Eyes open;Intermittent upper extremity support;5 reps   with head turns   SLS Eyes open;Solid surface;Intermittent upper extremity support    SLS with Vectors Solid surface;Intermittent upper extremity assist;5  reps   fwd/side/bwd   Retro Gait Theraband   green 2x20 steps; also fwd   Sidestepping Theraband   green 20 steps each way resisted by PT              PT Short Term Goals - 09/26/19 1514      PT SHORT TERM GOAL #1   Title The patient will have knowledge of initial HEP needed for balance and LE strengthening    Status Achieved             PT Long Term Goals - 10/02/19 1441      PT LONG TERM GOAL #1   Title The patient will be independent in safe self progression of HEP for further improvements in balance    Time 12    Period Weeks    Status On-going      PT LONG TERM GOAL #2   Title BERG balance score improved to 54/56 indicating decreased risk of falls    Time 12    Period Weeks    Status On-going      PT LONG TERM GOAL #3   Title 5x sit to stand to <14 sec for improved balance and reduced risk of falls    Time 12    Period Weeks    Status On-going      PT LONG TERM GOAL #4   Title LE strength grossly 4+/5  needed for safety with standing and walking    Baseline abduction and adduction 4-/5 MMT    Time 12    Period Weeks    Status On-going      PT LONG TERM GOAL #5   Title Pt will report feeling at least 7/10 confidenct in her balance    Baseline 4/10-5/10    Time 12    Period Weeks    Status On-going                 Plan - 10/09/19 1305    Clinical Impression Statement Patient progressing. She continues to be challenged with higher level balance activities. Left hip weakness with SLS.    Personal Factors and Comorbidities Comorbidity 1    Comorbidities chronic low back pain    Examination-Participation Restrictions Community Activity    PT Treatment/Interventions ADLs/Self Care Home Management;Biofeedback;Moist Heat;Electrical Stimulation;Cryotherapy;Therapeutic activities;Therapeutic exercise;Gait training;Neuromuscular re-education;Patient/family education;Taping;Passive range of motion;Manual techniques;Dry needling    PT Next Visit Plan check 5xsit to stand and hip strength for STGs    PT Home Exercise Plan Access Code: [email or web address]           Patient will benefit from skilled therapeutic intervention in order to improve the following deficits and impairments:  Decreased strength, Pain, Postural dysfunction, Decreased range of motion, Hypomobility, Abnormal gait, Decreased balance, Impaired flexibility  Visit Diagnosis: Other abnormalities of gait and mobility  Muscle weakness (generalized)     Problem List Patient Active Problem List   Diagnosis Date Noted  . Gastric ulcer 09/06/2018  . Anemia 09/06/2018  . Hypokalemia 09/05/2018  . HTN (hypertension) 09/05/2018  . Abnormal CT of the abdomen 09/05/2018    Madelyn Flavors PT 10/09/2019, 1:15 PM  [facility] [address] [place], [place], [address] Phone: [phone]   Fax:  [phone]  Name: [name] MRN: [id number] Date of Birth:  1932/08/22

## 2019-10-10 ENCOUNTER — Ambulatory Visit (INDEPENDENT_AMBULATORY_CARE_PROVIDER_SITE_OTHER): Payer: Medicare Other | Admitting: Cardiology

## 2019-10-10 ENCOUNTER — Encounter: Payer: Self-pay | Admitting: Cardiology

## 2019-10-10 VITALS — BP 130/70 | HR 62 | Ht 62.0 in | Wt 112.0 lb

## 2019-10-10 DIAGNOSIS — I10 Essential (primary) hypertension: Secondary | ICD-10-CM

## 2019-10-10 DIAGNOSIS — F4321 Adjustment disorder with depressed mood: Secondary | ICD-10-CM | POA: Diagnosis not present

## 2019-10-10 DIAGNOSIS — E0590 Thyrotoxicosis, unspecified without thyrotoxic crisis or storm: Secondary | ICD-10-CM

## 2019-10-10 NOTE — Patient Instructions (Signed)
Medication Instructions:  The current medical regimen is effective;  continue present plan and medications.  *If you need a refill on your cardiac medications before your next appointment, please call your pharmacy*  Follow-Up: At [facility], you and your health needs are our priority.  As part of our continuing mission to provide you with exceptional heart care, we have created designated Provider Care Teams.  These Care Teams include your primary Cardiologist (physician) and Advanced Practice Providers (APPs -  Physician Assistants and Nurse Practitioners) who all work together to provide you with the care you need, when you need it.  We recommend signing up for the patient portal called "MyChart".  Sign up information is provided on this After Visit Summary.  MyChart is used to connect with patients for Virtual Visits (Telemedicine).  Patients are able to view lab/test results, encounter notes, upcoming appointments, etc.  Non-urgent messages can be sent to your provider as well.   To learn more about what you can do with MyChart, go to [email or web address].    Your next appointment:   12 month(s)  The format for your next appointment:   In Person  Provider:   Mark Skains, MD   Thank you for choosing [facility]!!      

## 2019-10-10 NOTE — Progress Notes (Signed)
Cardiology Office Note:    Date:  10/10/2019   ID:  [name], DOB 21-Aug-1932, MRN [id number]  PCP:  Lajean Manes, MD  [facility] Cardiologist:  Candee Furbish, MD  [facility] Electrophysiologist:  None   Referring MD: Lajean Manes, MD    History of Present Illness:    [name] is a 86 y.o. female here for follow-up  post Whipple procedure in 2006.  Had prior lower extremity edema.  Had been on furosemide in the past.  Now off.  Taking Zestril or lisinopril for hypertension.  Her husband recently died with Alzheimer's, prostate cancer.  Still grieving his death.  Denies any chest pain.  No excessive swelling in her lower extremities.  She told me about solicitation in the mail about screening tests that she gets.  She states that all she needs to do was present her Medicare card and it would be free for her.  I asked her to be wary of these.  Dr. Felipa Eth would order any appropriate screening for her.   Past Medical History:  Diagnosis Date  . Aortic valve sclerosis   . C. difficile colitis   . Hyperlipidemia   . Hypothyroid   . Mild recurrent major depression ([place])   . Osteopenia   . Pancreatic cancer [facility])    Pancreatic  . Vertigo   . Vitamin D deficiency     Past Surgical History:  Procedure Laterality Date  . BLADDER SUSPENSION  1999  . CATARACT EXTRACTION  2011   bilateral  . CHOLECYSTECTOMY  1992  . ESOPHAGOGASTRODUODENOSCOPY (EGD) WITH PROPOFOL N/A 09/06/2018   Procedure: ESOPHAGOGASTRODUODENOSCOPY (EGD) WITH PROPOFOL;  Surgeon: Wonda Horner, MD;  [facility];  Service: Endoscopy;  Laterality: N/A;  . lower back surgery  1994, 1995   L4-5  . multiple hernias  2007, 2012   ventral  . PARTIAL HYSTERECTOMY  1973  . SHOULDER ARTHROSCOPY  2011   rt  . TONSILLECTOMY  1942  . WHIPPLE PROCEDURE  2006    Current Medications: Current Meds  Medication Sig  . Ascorbic Acid (VITAMIN C) 1000 MG tablet Take 1,000 mg by mouth  daily.  . cholestyramine (QUESTRAN) 4 GM/DOSE powder Take 4 g by mouth 2 (two) times daily as needed (diarrhea).   . diclofenac sodium (VOLTAREN) 1 % GEL Apply 2 g topically daily as needed (pain).   . Glycerin-Hypromellose-PEG 400 (DRY EYE RELIEF DROPS) 0.2-0.2-1 % SOLN Apply 1 drop to eye 2 (two) times daily.  . Iron, Ferrous Sulfate, 325 (65 Fe) MG TABS Take 325 mg by mouth daily.  Marland Kitchen lisinopril (ZESTRIL) 10 MG tablet Take 10 mg by mouth daily.  . Multiple Vitamins-Minerals (COMPLETE MULTIVITAMIN/MINERAL PO) Take 1 tablet by mouth daily.  . pantoprazole (PROTONIX) 40 MG tablet Take 1 tablet (40 mg total) by mouth 2 (two) times daily.  . Probiotic Product (PROBIOTIC DAILY PO) Take 1 capsule by mouth daily.   . sertraline (ZOLOFT) 50 MG tablet Take 25 mg by mouth at bedtime.  . Vitamin D, Ergocalciferol, (DRISDOL) 50000 UNITS CAPS Take 50,000 Units by mouth 2 (two) times a week. Every Wednesday and Saturday       Allergies:   Ivp dye [iodinated diagnostic agents], Amoxicillin, Ciprofloxacin, Dextromethorphan polistirex er, Diatrizoate meglumine & sodium, Flagyl [metronidazole], Hydrocodone, Methocarbamol, Oxycodone, Prednisone, Promethazine hcl, Sucralfate, and Tape   Social History   Socioeconomic History  . Marital status: Married    Spouse name: Not on file  . Number  of children: Not on file  . Years of education: Not on file  . Highest education level: Not on file  Occupational History  . Not on file  Tobacco Use  . Smoking status: Never Smoker  . Smokeless tobacco: Never Used  Vaping Use  . Vaping Use: Never used  Substance and Sexual Activity  . Alcohol use: Yes  . Drug use: Never  . Sexual activity: Not on file  Other Topics Concern  . Not on file  Social History Narrative  . Not on file   Social Determinants of Health   Financial Resource Strain:   . Difficulty of Paying Living Expenses: Not on file  Food Insecurity:   . Worried About Charity fundraiser in the  Last Year: Not on file  . Ran Out of Food in the Last Year: Not on file  Transportation Needs:   . Lack of Transportation (Medical): Not on file  . Lack of Transportation (Non-Medical): Not on file  Physical Activity:   . Days of Exercise per Week: Not on file  . Minutes of Exercise per Session: Not on file  Stress:   . Feeling of Stress : Not on file  Social Connections:   . Frequency of Communication with Friends and Family: Not on file  . Frequency of Social Gatherings with Friends and Family: Not on file  . Attends Religious Services: Not on file  . Active Member of Clubs or Organizations: Not on file  . Attends Archivist Meetings: Not on file  . Marital Status: Not on file     Family History: The patient's family history includes Breast cancer in her sister; Cancer in her maternal grandfather and sister; Heart attack in her father; Heart disease in her maternal grandmother and mother; Heart failure in her mother; Hypertension in her mother, sister, and sister.  ROS:   Please see the history of present illness.     All other systems reviewed and are negative.  EKGs/Labs/Other Studies Reviewed:     EKG:  EKG is  ordered today.  The ekg ordered today demonstrates 62 sinus rhythm  Recent Labs: No results found for requested labs within last 8760 hours.  Recent Lipid Panel No results found for: CHOL, TRIG, HDL, CHOLHDL, VLDL, LDLCALC, LDLDIRECT  Physical Exam:    VS:  BP 130/70   Pulse 62   Ht 5\' 2"  (1.575 m)   Wt 112 lb (50.8 kg)   SpO2 94%   BMI 20.49 kg/m     Wt Readings from Last 3 Encounters:  10/10/19 112 lb (50.8 kg)  09/06/18 112 lb 14 oz (51.2 kg)  06/26/18 119 lb (54 kg)     GEN:  Thin in no acute distress HEENT: Normal NECK: No JVD; No carotid bruits LYMPHATICS: No lymphadenopathy CARDIAC: RRR, soft systolic murmur, rubs, gallops RESPIRATORY:  Clear to auscultation without rales, wheezing or rhonchi  ABDOMEN: Soft, non-tender,  non-distended MUSCULOSKELETAL:  No edema; No deformity  SKIN: Warm and dry NEUROLOGIC:  Alert and oriented x 3 PSYCHIATRIC:  Normal affect   ASSESSMENT:    1. Essential hypertension   2. Hyperthyroidism   3. Grief    PLAN:    In order of problems listed above:  Lower extremity edema -Seems to have resolved.  Likely edema at times from venous insufficiency.  Does not need any Lasix at this time.  Prior atypical chest pain -Nuclear stress test previously reassuring.  Possibly GI or musculoskeletal related.  Hypothyroidism -  Synthroid has been previously adjusted.  Grieving the loss of her husband.  Essential hypertension -On lisinopril.  Doing well.  No side effects.  Continue medication.  LDL 121 hemoglobin A1c 6.0 creatinine 0.9 hemoglobin 12.7 reviewed from outside labs.   Medication Adjustments/Labs and Tests Ordered: Current medicines are reviewed at length with the patient today.  Concerns regarding medicines are outlined above.  Orders Placed This Encounter  Procedures  . EKG 12-Lead   No orders of the defined types were placed in this encounter.   Patient Instructions  Medication Instructions:  The current medical regimen is effective;  continue present plan and medications.  *If you need a refill on your cardiac medications before your next appointment, please call your pharmacy*  Follow-Up: At [facility], you and your health needs are our priority.  As part of our continuing mission to provide you with exceptional heart care, we have created designated Provider Care Teams.  These Care Teams include your primary Cardiologist (physician) and Advanced Practice Providers (APPs -  Physician Assistants and Nurse Practitioners) who all work together to provide you with the care you need, when you need it.  We recommend signing up for the patient portal called "MyChart".  Sign up information is provided on this After Visit Summary.  MyChart is used to connect  with patients for Virtual Visits (Telemedicine).  Patients are able to view lab/test results, encounter notes, upcoming appointments, etc.  Non-urgent messages can be sent to your provider as well.   To learn more about what you can do with MyChart, go to [email or web address].    Your next appointment:   12 month(s)  The format for your next appointment:   In Person  Provider:   Candee Furbish, MD   Thank you for choosing [facility]        Signed, Candee Furbish, MD  10/10/2019 3:58 PM    [place]

## 2019-10-15 DIAGNOSIS — I10 Essential (primary) hypertension: Secondary | ICD-10-CM | POA: Diagnosis not present

## 2019-10-15 DIAGNOSIS — R0981 Nasal congestion: Secondary | ICD-10-CM | POA: Diagnosis not present

## 2019-10-15 DIAGNOSIS — Z79899 Other long term (current) drug therapy: Secondary | ICD-10-CM | POA: Diagnosis not present

## 2019-10-15 DIAGNOSIS — Z634 Disappearance and death of family member: Secondary | ICD-10-CM | POA: Diagnosis not present

## 2019-10-23 ENCOUNTER — Ambulatory Visit: Payer: Medicare Other | Admitting: Physical Therapy

## 2019-10-23 ENCOUNTER — Other Ambulatory Visit: Payer: Self-pay

## 2019-10-23 ENCOUNTER — Encounter: Payer: Medicare Other | Admitting: Physical Therapy

## 2019-10-23 ENCOUNTER — Encounter: Payer: Self-pay | Admitting: Physical Therapy

## 2019-10-23 DIAGNOSIS — R2689 Other abnormalities of gait and mobility: Secondary | ICD-10-CM

## 2019-10-23 DIAGNOSIS — M6281 Muscle weakness (generalized): Secondary | ICD-10-CM

## 2019-10-23 NOTE — Therapy (Signed)
[facility] [address] [place], [place], [address] Phone: [phone]   Fax:  [phone]  Physical Therapy Treatment  Patient Details  Name: [name] MRN: [id number] Date of Birth: 01/09/1933 Referring Provider (PT): Lajean Manes, MD   Encounter Date: 10/23/2019   PT End of Session - 10/23/19 0934    Visit Number 8    Date for PT Re-Evaluation 12/09/19    Authorization Type medicare    PT Start Time 0932    PT Stop Time 1013    PT Time Calculation (min) 41 min    Activity Tolerance Patient tolerated treatment well    Behavior During Therapy [facility] for tasks assessed/performed           Past Medical History:  Diagnosis Date  . Aortic valve sclerosis   . C. difficile colitis   . Hyperlipidemia   . Hypothyroid   . Mild recurrent major depression ([place])   . Osteopenia   . Pancreatic cancer [facility])    Pancreatic  . Vertigo   . Vitamin D deficiency     Past Surgical History:  Procedure Laterality Date  . BLADDER SUSPENSION  1999  . CATARACT EXTRACTION  2011   bilateral  . CHOLECYSTECTOMY  1992  . ESOPHAGOGASTRODUODENOSCOPY (EGD) WITH PROPOFOL N/A 09/06/2018   Procedure: ESOPHAGOGASTRODUODENOSCOPY (EGD) WITH PROPOFOL;  Surgeon: Wonda Horner, MD;  Location: [facility];  Service: Endoscopy;  Laterality: N/A;  . lower back surgery  1994, 1995   L4-5  . multiple hernias  2007, 2012   ventral  . PARTIAL HYSTERECTOMY  1973  . SHOULDER ARTHROSCOPY  2011   rt  . TONSILLECTOMY  1942  . WHIPPLE PROCEDURE  2006    There were no vitals filed for this visit.   Subjective Assessment - 10/23/19 0935    Subjective Pt states she has been busy and not doing the exercises like she should, but feeling better overall.                             [facility] PT Treatment/Exercise - 10/23/19 0001      Neuro Re-ed    Neuro Re-ed Details  ladder - stepping over one foot per square and  large step into each square; side step 2x each way      Knee/Hip Exercises: Aerobic   Nustep Level 3x 3 minutes ; L2x 5 min (arms/legs)   reduced to level 2 due to feeling more out of breath today     Knee/Hip Exercises: Standing   Heel Raises Both;1 set;20 reps   and toe raises   Hip Flexion 20 reps;Knee bent   1.5 lb added   Hip Abduction Stengthening;Both;2 sets;10 reps   1.5 lb added   Lateral Step Up Left;Step Height: 6";10 reps;Right;Hand Hold: 2   light tough   Other Standing Knee Exercises tandem stand on foam mat - head turns and looking up and down      Knee/Hip Exercises: Seated   Sit to Sand 10 reps   cues to keep knees aBDucted                   PT Short Term Goals - 09/26/19 1514      PT SHORT TERM GOAL #1   Title The patient will have knowledge of initial HEP needed for balance and LE strengthening    Status Achieved  PT Long Term Goals - 10/02/19 1441      PT LONG TERM GOAL #1   Title The patient will be independent in safe self progression of HEP for further improvements in balance    Time 12    Period Weeks    Status On-going      PT LONG TERM GOAL #2   Title BERG balance score improved to 54/56 indicating decreased risk of falls    Time 12    Period Weeks    Status On-going      PT LONG TERM GOAL #3   Title 5x sit to stand to <14 sec for improved balance and reduced risk of falls    Time 12    Period Weeks    Status On-going      PT LONG TERM GOAL #4   Title LE strength grossly 4+/5 needed for safety with standing and walking    Baseline abduction and adduction 4-/5 MMT    Time 12    Period Weeks    Status On-going      PT LONG TERM GOAL #5   Title Pt will report feeling at least 7/10 confidenct in her balance    Baseline 4/10-5/10    Time 12    Period Weeks    Status On-going                 Plan - 10/23/19 1013    Clinical Impression Statement Pt did well with her exercises today.  She needs  encouragement and close supervision to not use UE support during exercises.  Pt tolerated more exercises today and reports feeling energized at the end of her session.  Continue with strength and balance    PT Treatment/Interventions ADLs/Self Care Home Management;Biofeedback;Moist Heat;Electrical Stimulation;Cryotherapy;Therapeutic activities;Therapeutic exercise;Gait training;Neuromuscular re-education;Patient/family education;Taping;Passive range of motion;Manual techniques;Dry needling    PT Next Visit Plan check 5xsit to stand and hip strength for STGs, balance dynamic and statis    PT Home Exercise Plan Access Code: [email or web address]    Consulted and Agree with Plan of Care Patient           Patient will benefit from skilled therapeutic intervention in order to improve the following deficits and impairments:  Decreased strength, Pain, Postural dysfunction, Decreased range of motion, Hypomobility, Abnormal gait, Decreased balance, Impaired flexibility  Visit Diagnosis: Muscle weakness (generalized)  Other abnormalities of gait and mobility     Problem List Patient Active Problem List   Diagnosis Date Noted  . Gastric ulcer 09/06/2018  . Anemia 09/06/2018  . Hypokalemia 09/05/2018  . HTN (hypertension) 09/05/2018  . Abnormal CT of the abdomen 09/05/2018    Jule Ser, PT 10/23/2019, 10:16 AM  [facility] [address], [place], [place], [address] Phone: [phone]   Fax:  [phone]  Name: [name] MRN: [id number] Date of Birth: Apr 07, 1932

## 2019-10-25 ENCOUNTER — Encounter: Payer: Medicare Other | Admitting: Physical Therapy

## 2019-10-30 ENCOUNTER — Encounter: Payer: Medicare Other | Admitting: Physical Therapy

## 2019-11-04 ENCOUNTER — Other Ambulatory Visit: Payer: Self-pay

## 2019-11-04 ENCOUNTER — Ambulatory Visit: Payer: Medicare Other | Attending: Geriatric Medicine

## 2019-11-04 DIAGNOSIS — M6281 Muscle weakness (generalized): Secondary | ICD-10-CM

## 2019-11-04 DIAGNOSIS — R2689 Other abnormalities of gait and mobility: Secondary | ICD-10-CM | POA: Insufficient documentation

## 2019-11-04 NOTE — Therapy (Signed)
[facility] [address] [place], [place], [address] Phone: [phone]   Fax:  [phone]  Physical Therapy Treatment  Patient Details  Name: [name] MRN: [id number] Date of Birth: August 19, 1932 Referring Provider (PT): Lajean Manes, MD   Encounter Date: 11/04/2019   PT End of Session - 11/04/19 1446    Visit Number 9    Date for PT Re-Evaluation 12/09/19    Authorization Type medicare    PT Start Time 1400    PT Stop Time 1446    PT Time Calculation (min) 46 min    Activity Tolerance Patient tolerated treatment well    Behavior During Therapy [facility] for tasks assessed/performed           Past Medical History:  Diagnosis Date  . Aortic valve sclerosis   . C. difficile colitis   . Hyperlipidemia   . Hypothyroid   . Mild recurrent major depression ([place])   . Osteopenia   . Pancreatic cancer [facility])    Pancreatic  . Vertigo   . Vitamin D deficiency     Past Surgical History:  Procedure Laterality Date  . BLADDER SUSPENSION  1999  . CATARACT EXTRACTION  2011   bilateral  . CHOLECYSTECTOMY  1992  . ESOPHAGOGASTRODUODENOSCOPY (EGD) WITH PROPOFOL N/A 09/06/2018   Procedure: ESOPHAGOGASTRODUODENOSCOPY (EGD) WITH PROPOFOL;  Surgeon: Wonda Horner, MD;  [facility];  Service: Endoscopy;  Laterality: N/A;  . lower back surgery  1994, 1995   L4-5  . multiple hernias  2007, 2012   ventral  . PARTIAL HYSTERECTOMY  1973  . SHOULDER ARTHROSCOPY  2011   rt  . TONSILLECTOMY  1942  . WHIPPLE PROCEDURE  2006    There were no vitals filed for this visit.   Subjective Assessment - 11/04/19 1358    Subjective I've had more time to do my exercises.  I feel like I am getting better overall.    Patient Stated Goals increase balance and strength    Currently in Pain? No/denies              [facility] PT Assessment - 11/04/19 0001      Standardized Balance Assessment   Five times sit to stand  comments  12.67 seconds                         [facility] Adult PT Treatment/Exercise - 11/04/19 0001      Knee/Hip Exercises: Aerobic   Nustep Level 2x 8 minutes-PT present to monitor for fatigue   reduced to level 2 due to feeling more out of breath today     Knee/Hip Exercises: Machines for Strengthening   Total Gym Leg Press seat 6: bil legs 60# x 30, Rt and Lt 2x10 30#      Knee/Hip Exercises: Standing   Heel Raises Both;1 set;20 reps   on black pad   Rebounder weight shifting 3 ways x1 min each    Other Standing Knee Exercises alternating step taps while standing on balance pad 3x10     Other Standing Knee Exercises tandem stand on foam mat - head turns and looking up and down      Knee/Hip Exercises: Seated   Sit to Sand 10 reps                  PT Education - 11/04/19 1443    Education Details issued information on balance pad for use  at home.  Educated regarding how to use safely    Person(s) Educated Patient    Methods Explanation;Demonstration    Comprehension Verbalized understanding            PT Short Term Goals - 11/04/19 1405      PT SHORT TERM GOAL #1   Title The patient will have knowledge of initial HEP needed for balance and LE strengthening    Status Achieved      PT SHORT TERM GOAL #2   Title improved hip abduction and adduciton to 4/5 MMT for improved gait    Baseline 4/5 Lt hip abduction 4+/5 adduction    Time 4    Period Weeks    Status On-going      PT SHORT TERM GOAL #3   Title 5 x sit to stand <18 sec    Status Achieved             PT Long Term Goals - 11/04/19 1431      PT LONG TERM GOAL #3   Title 5x sit to stand to <14 sec for improved balance and reduced risk of falls    Status Achieved                 Plan - 11/04/19 1431    Clinical Impression Statement Pt did well with her exercises today.  She needs encouragement and close supervision to not use UE support during exercises.  Pt has tolerated  more exercise over the past couple of weeks and reports feeling energized at the end of her session. 5x it to stand is improved to 12.67 seconds indicating improved overall balance.  Lt hip remains 4/5, and Rt hip is improved to 4+/5.  Pt required close supervision with activity standing on black balance pad.  Pt will continue to benefit from skilled PT to address strength, endurance and balance.    PT Frequency 2x / week    PT Duration 12 weeks    PT Treatment/Interventions ADLs/Self Care Home Management;Biofeedback;Moist Heat;Electrical Stimulation;Cryotherapy;Therapeutic activities;Therapeutic exercise;Gait training;Neuromuscular re-education;Patient/family education;Taping;Passive range of motion;Manual techniques;Dry needling    PT Next Visit Plan balance, steps, endurance and strength. 10th visit summary-tested 5xsit to stand today and strength.  Test Merrilee Jansky for goal.    PT Home Exercise Plan Access Code: [email or web address]    Consulted and Agree with Plan of Care Patient           Patient will benefit from skilled therapeutic intervention in order to improve the following deficits and impairments:  Decreased strength, Pain, Postural dysfunction, Decreased range of motion, Hypomobility, Abnormal gait, Decreased balance, Impaired flexibility  Visit Diagnosis: Muscle weakness (generalized)  Other abnormalities of gait and mobility     Problem List Patient Active Problem List   Diagnosis Date Noted  . Gastric ulcer 09/06/2018  . Anemia 09/06/2018  . Hypokalemia 09/05/2018  . HTN (hypertension) 09/05/2018  . Abnormal CT of the abdomen 09/05/2018    Sigurd Sos, PT 11/04/19 2:51 PM   [facility] [address], [place], [place], [address] Phone: [phone]   Fax:  [phone]  Name: [name] MRN: [id number] Date of Birth: Jun 02, 1932

## 2019-11-06 ENCOUNTER — Ambulatory Visit: Payer: Medicare Other

## 2019-11-06 ENCOUNTER — Other Ambulatory Visit: Payer: Self-pay

## 2019-11-06 DIAGNOSIS — I10 Essential (primary) hypertension: Secondary | ICD-10-CM | POA: Diagnosis not present

## 2019-11-06 DIAGNOSIS — Z23 Encounter for immunization: Secondary | ICD-10-CM | POA: Diagnosis not present

## 2019-11-06 DIAGNOSIS — M6281 Muscle weakness (generalized): Secondary | ICD-10-CM | POA: Diagnosis not present

## 2019-11-06 DIAGNOSIS — R2689 Other abnormalities of gait and mobility: Secondary | ICD-10-CM | POA: Diagnosis not present

## 2019-11-06 NOTE — Therapy (Signed)
[facility] [address] [place], [place], [address] Phone: [phone]   Fax:  [phone]  Physical Therapy Treatment  Patient Details  Name: [name] MRN: [id number] Date of Birth: Feb 06, 1932 Referring Provider (PT): Lajean Manes, MD   Encounter Date: 11/06/2019 Progress Note Reporting Period 09/16/2019 to 11/06/2019  See note below for Objective Data and Assessment of Progress/Goals.       PT End of Session - 11/06/19 1442    Visit Number 10    Date for PT Re-Evaluation 12/09/19    Authorization Type medicare    PT Start Time 1400    PT Stop Time 1443    PT Time Calculation (min) 43 min    Activity Tolerance Patient tolerated treatment well    Behavior During Therapy [facility] for tasks assessed/performed           Past Medical History:  Diagnosis Date  . Aortic valve sclerosis   . C. difficile colitis   . Hyperlipidemia   . Hypothyroid   . Mild recurrent major depression ([place])   . Osteopenia   . Pancreatic cancer [facility])    Pancreatic  . Vertigo   . Vitamin D deficiency     Past Surgical History:  Procedure Laterality Date  . BLADDER SUSPENSION  1999  . CATARACT EXTRACTION  2011   bilateral  . CHOLECYSTECTOMY  1992  . ESOPHAGOGASTRODUODENOSCOPY (EGD) WITH PROPOFOL N/A 09/06/2018   Procedure: ESOPHAGOGASTRODUODENOSCOPY (EGD) WITH PROPOFOL;  Surgeon: Wonda Horner, MD;  [facility];  Service: Endoscopy;  Laterality: N/A;  . lower back surgery  1994, 1995   L4-5  . multiple hernias  2007, 2012   ventral  . PARTIAL HYSTERECTOMY  1973  . SHOULDER ARTHROSCOPY  2011   rt  . TONSILLECTOMY  1942  . WHIPPLE PROCEDURE  2006    There were no vitals filed for this visit.   Subjective Assessment - 11/06/19 1404    Subjective I am working on my posture more frequently.    Currently in Pain? No/denies              [facility] PT Assessment - 11/06/19 0001      Assessment    Medical Diagnosis R26.9 (ICD-10-CM) - Unspecified abnormalities of gait and mobility    Referring Provider (PT) Lajean Manes, MD    Onset Date/Surgical Date --   insidious onset     Strength   Overall Strength Deficits    Overall Strength Comments Lt hip abduction 4/5, Rt 4+/5.      Standardized Balance Assessment   Five times sit to stand comments  11.87 seconds                         [facility] Adult PT Treatment/Exercise - 11/06/19 0001      Knee/Hip Exercises: Aerobic   Nustep Level 2x 8 minutes-PT present to monitor for fatigue   reduced to level 2 due to feeling more out of breath today     Knee/Hip Exercises: Machines for Strengthening   Total Gym Leg Press seat 6: bil legs 60# x 30, Rt and Lt 2x10 30#      Knee/Hip Exercises: Standing   Heel Raises Both;1 set;20 reps   on black pad   Hip ADduction Strengthening;Both;2 sets;10 reps    Rebounder weight shifting 3 ways x1 min each    Other Standing Knee Exercises alternating step taps while standing on balance  pad 3x10     Other Standing Knee Exercises tandem stand on foam mat 2x30 seconds, body rotation Rt and Lt  with feet together      Knee/Hip Exercises: Seated   Sit to Sand 10 reps                    PT Short Term Goals - 11/04/19 1405      PT SHORT TERM GOAL #1   Title The patient will have knowledge of initial HEP needed for balance and LE strengthening    Status Achieved      PT SHORT TERM GOAL #2   Title improved hip abduction and adduciton to 4/5 MMT for improved gait    Baseline 4/5 Lt hip abduction 4+/5 adduction    Time 4    Period Weeks    Status On-going      PT SHORT TERM GOAL #3   Title 5 x sit to stand <18 sec    Status Achieved             PT Long Term Goals - 11/06/19 1401      PT LONG TERM GOAL #1   Title The patient will be independent in safe self progression of HEP for further improvements in balance    Time 12    Period Weeks    Status On-going      PT  LONG TERM GOAL #3   Title 5x sit to stand to <14 sec for improved balance and reduced risk of falls    Baseline 11.87 seconds    Status Achieved      PT LONG TERM GOAL #4   Title LE strength grossly 4+/5 needed for safety with standing and walking    Baseline Lt abduction 4/5, Rt 4+/5    Time 12    Period Weeks    Status On-going      PT LONG TERM GOAL #5   Title Pt will report feeling at least 7/10 confidenct in her balance    Baseline 7/10    Status Achieved                 Plan - 11/06/19 1413    Clinical Impression Statement Pt reports 7/10 confidence with balance which is significantly improved since the start of start.  Pt performed 5x sit to stand in 11.87 seconds indicating a reduced falls risk overall.  Pt did well with her exercises today.  She needs encouragement and close supervision to not use UE support during exercises. Lt hip remains 4/5, and Rt hip is improved to 4+/5.  Pt required close supervision with activity standing on black balance pad.  Pt has ordered a balance pad and PT provided verbal cueing for safety with use at home.  Pt will continue to benefit from skilled PT to address strength, endurance and balance.    PT Frequency 2x / week    PT Duration 12 weeks    PT Treatment/Interventions ADLs/Self Care Home Management;Biofeedback;Moist Heat;Electrical Stimulation;Cryotherapy;Therapeutic activities;Therapeutic exercise;Gait training;Neuromuscular re-education;Patient/family education;Taping;Passive range of motion;Manual techniques;Dry needling    PT Next Visit Plan Test Berg for LTG.  Balance, steps, endurance and strength    PT Home Exercise Plan Access Code: [email or web address]    Consulted and Agree with Plan of Care Patient           Patient will benefit from skilled therapeutic intervention in order to improve the following deficits and impairments:  Decreased strength, Pain, Postural dysfunction, Decreased  range of motion, Hypomobility, Abnormal gait,  Decreased balance, Impaired flexibility  Visit Diagnosis: Other abnormalities of gait and mobility  Muscle weakness (generalized)     Problem List Patient Active Problem List   Diagnosis Date Noted  . Gastric ulcer 09/06/2018  . Anemia 09/06/2018  . Hypokalemia 09/05/2018  . HTN (hypertension) 09/05/2018  . Abnormal CT of the abdomen 09/05/2018   Sigurd Sos, PT 11/06/19 2:46 PM  [facility] [address] [place], [place], [address] Phone: [phone]   Fax:  [phone]  Name: Tashika Goodin MRN: [id number] Date of Birth: 05-Apr-1932

## 2019-11-12 ENCOUNTER — Ambulatory Visit: Payer: Medicare Other

## 2019-11-15 DIAGNOSIS — I10 Essential (primary) hypertension: Secondary | ICD-10-CM | POA: Diagnosis not present

## 2019-11-15 DIAGNOSIS — H903 Sensorineural hearing loss, bilateral: Secondary | ICD-10-CM | POA: Diagnosis not present

## 2019-11-21 ENCOUNTER — Ambulatory Visit: Payer: Medicare Other | Admitting: Physical Therapy

## 2019-11-27 DIAGNOSIS — H903 Sensorineural hearing loss, bilateral: Secondary | ICD-10-CM | POA: Diagnosis not present

## 2019-12-03 DIAGNOSIS — M5417 Radiculopathy, lumbosacral region: Secondary | ICD-10-CM | POA: Diagnosis not present

## 2019-12-03 DIAGNOSIS — M5116 Intervertebral disc disorders with radiculopathy, lumbar region: Secondary | ICD-10-CM | POA: Diagnosis not present

## 2019-12-05 ENCOUNTER — Ambulatory Visit: Payer: Medicare Other | Admitting: Physical Therapy

## 2019-12-05 ENCOUNTER — Ambulatory Visit: Payer: Medicare Other | Admitting: Podiatry

## 2019-12-12 ENCOUNTER — Other Ambulatory Visit: Payer: Self-pay

## 2019-12-12 ENCOUNTER — Ambulatory Visit: Payer: Medicare Other | Attending: Geriatric Medicine

## 2019-12-12 DIAGNOSIS — M6281 Muscle weakness (generalized): Secondary | ICD-10-CM | POA: Insufficient documentation

## 2019-12-12 DIAGNOSIS — R2689 Other abnormalities of gait and mobility: Secondary | ICD-10-CM | POA: Insufficient documentation

## 2019-12-12 NOTE — Therapy (Addendum)
[facility] [address] [place], [place], [address] Phone: [phone]   Fax:  [phone]  Physical Therapy Treatment  Patient Details  Name: [name] MRN: [id number] Date of Birth: 03-08-1932 Referring Provider (PT): Lajean Manes, MD   Encounter Date: 12/12/2019   PT End of Session - 12/12/19 1020    Visit Number 11    Date for PT Re-Evaluation 01/23/20    Authorization Type medicare- traditional    PT Start Time 0931    PT Stop Time 7654    PT Time Calculation (min) 43 min    Activity Tolerance Patient tolerated treatment well    Behavior During Therapy [facility] for tasks assessed/performed           Past Medical History:  Diagnosis Date  . Aortic valve sclerosis   . C. difficile colitis   . Hyperlipidemia   . Hypothyroid   . Mild recurrent major depression ([place])   . Osteopenia   . Pancreatic cancer [facility])    Pancreatic  . Vertigo   . Vitamin D deficiency     Past Surgical History:  Procedure Laterality Date  . BLADDER SUSPENSION  1999  . CATARACT EXTRACTION  2011   bilateral  . CHOLECYSTECTOMY  1992  . ESOPHAGOGASTRODUODENOSCOPY (EGD) WITH PROPOFOL N/A 09/06/2018   Procedure: ESOPHAGOGASTRODUODENOSCOPY (EGD) WITH PROPOFOL;  Surgeon: Wonda Horner, MD;  Location: [facility];  Service: Endoscopy;  Laterality: N/A;  . lower back surgery  1994, 1995   L4-5  . multiple hernias  2007, 2012   ventral  . PARTIAL HYSTERECTOMY  1973  . SHOULDER ARTHROSCOPY  2011   rt  . TONSILLECTOMY  1942  . WHIPPLE PROCEDURE  2006    There were no vitals filed for this visit.   Subjective Assessment - 12/12/19 0936    Subjective I have a balance pad at home.  My balance confidence is 8/10 now.    Currently in Pain? No/denies              [facility] PT Assessment - 12/12/19 0001      Assessment   Medical Diagnosis R26.9 (ICD-10-CM) - Unspecified abnormalities of gait and mobility    Referring  Provider (PT) Lajean Manes, MD    Onset Date/Surgical Date --   insidious onset     Strength   Overall Strength Deficits    Overall Strength Comments Lt abduction 4/5, bil hip flexion 4/5, knees 4+/5 bil      Standardized Balance Assessment   Five times sit to stand comments  11.87 seconds      Berg Balance Test   Sit to Stand Able to stand without using hands and stabilize independently    Standing Unsupported Able to stand safely 2 minutes    Sitting with Back Unsupported but Feet Supported on Floor or Stool Able to sit safely and securely 2 minutes    Stand to Sit Sits safely with minimal use of hands    Transfers Able to transfer safely, minor use of hands    Standing Unsupported with Eyes Closed Able to stand 10 seconds safely    Standing Unsupported with Feet Together Able to place feet together independently and stand 1 minute safely    From Standing, Reach Forward with Outstretched Arm Can reach forward >12 cm safely (5")    From Standing Position, Pick up Object from Floor Able to pick up shoe safely and easily    From Standing Position,  Turn to Look Behind Over each Shoulder Looks behind one side only/other side shows less weight shift    Turn 360 Degrees Able to turn 360 degrees safely one side only in 4 seconds or less    Standing Unsupported, Alternately Place Feet on Step/Stool Able to stand independently and safely and complete 8 steps in 20 seconds    Standing Unsupported, One Foot in Front Able to plae foot ahead of the other independently and hold 30 seconds    Standing on One Leg Able to lift leg independently and hold 5-10 seconds    Total Score 51    Berg comment: 51/56                          [facility] Adult PT Treatment/Exercise - 12/12/19 0001      Knee/Hip Exercises: Aerobic   Nustep Level 2x 10 minutes-PT present to monitor for fatigue      Knee/Hip Exercises: Machines for Strengthening   Total Gym Leg Press seat 6: bil legs 60# x 30, Rt and  Lt 2x10 30#      Knee/Hip Exercises: Standing   Rebounder weight shifting 3 ways x1 min each      Knee/Hip Exercises: Seated   Sit to Sand 10 reps   holding 10# kettle bell                   PT Short Term Goals - 11/04/19 1405      PT SHORT TERM GOAL #1   Title The patient will have knowledge of initial HEP needed for balance and LE strengthening    Status Achieved      PT SHORT TERM GOAL #2   Title improved hip abduction and adduciton to 4/5 MMT for improved gait    Baseline 4/5 Lt hip abduction 4+/5 adduction    Time 4    Period Weeks    Status On-going      PT SHORT TERM GOAL #3   Title 5 x sit to stand <18 sec    Status Achieved             PT Long Term Goals - 12/12/19 3903      PT LONG TERM GOAL #1   Title The patient will be independent in safe self progression of HEP for further improvements in balance    Time 6    Period Weeks    Status On-going      PT LONG TERM GOAL #2   Title BERG balance score improved to 54/56 indicating decreased risk of falls    Baseline 51/56    Time 6    Period Weeks    Status On-going      PT LONG TERM GOAL #3   Title perform single leg stance x 8 seconds on each leg 2/3 trials to improve overall stability    Baseline 6-8 seconds- 1 trial    Time 6    Period Weeks    Status New    Target Date 01/23/20      PT LONG TERM GOAL #4   Title LE strength grossly 4+/5 needed for safety with standing and walking    Baseline Lt abduction 4/5, bil hip flexion 4/5, knees 4+/5    Time 6    Period Weeks    Status On-going    Target Date 01/23/20      PT LONG TERM GOAL #5   Title Pt will report  feeling at least 9/10 confidenct in her balance    Baseline 8/10    Time 6    Period Weeks    Status Revised    Target Date 01/23/20                 Plan - 12/12/19 0956    Clinical Impression Statement Pt reports 8/10 confidence in her balance now.  Hip and knee strength remains limited although improved since  evaluation.  Berg Score is unchanged but was already 51/56 (indicating some falls risk)  at evaluation so little chance of improvement on this. Pt is able to perform sit to stand in< 12 seconds indicating reduced falls risk.  Pt requires supervision with standing balance tasks for safety.  Pt is independent and complaint with HEP for balance tasks and strength/endurance gains.  Pt will continue to benefit from skilled PT to address balance and strength deficits to improve safety and independence at home and in the community.    Rehab Potential Excellent    PT Frequency 2x / week    PT Duration 6 weeks    PT Treatment/Interventions ADLs/Self Care Home Management;Biofeedback;Moist Heat;Electrical Stimulation;Cryotherapy;Therapeutic activities;Therapeutic exercise;Gait training;Neuromuscular re-education;Patient/family education;Taping;Passive range of motion;Manual techniques;Dry needling    PT Next Visit Plan Balance, work on tandem stance, and single leg stance.    PT Home Exercise Plan Access Code: [email or web address]    Recommended Other Services recert sent 48/54/62    Consulted and Agree with Plan of Care Patient           Patient will benefit from skilled therapeutic intervention in order to improve the following deficits and impairments:  Decreased strength, Pain, Postural dysfunction, Decreased range of motion, Hypomobility, Abnormal gait, Decreased balance, Impaired flexibility  Visit Diagnosis: Muscle weakness (generalized) - Plan: PT plan of care cert/re-cert  Other abnormalities of gait and mobility - Plan: PT plan of care cert/re-cert     Problem List Patient Active Problem List   Diagnosis Date Noted  . Gastric ulcer 09/06/2018  . Anemia 09/06/2018  . Hypokalemia 09/05/2018  . HTN (hypertension) 09/05/2018  . Abnormal CT of the abdomen 09/05/2018    [name] 12/12/2019, 10:22 AM PHYSICAL THERAPY DISCHARGE SUMMARY  Visits from Start of Care: 11  Current functional level  related to goals / functional outcomes: Pt didn't return to PT after 12/12/19.  See above for most current status.     Remaining deficits: See above.     Education / Equipment: HEP Plan: Patient agrees to discharge.  Patient goals were not met. Patient is being discharged due to meeting the stated rehab goals.  ?????        Sigurd Sos, PT 02/20/20 8:54 AM   [facility] [address] [place], [place], [address] Phone: [phone]   Fax:  [phone]  Name: [name] MRN: [id number] Date of Birth: [birth year]

## 2019-12-16 ENCOUNTER — Ambulatory Visit: Payer: Medicare Other | Admitting: Podiatry

## 2019-12-23 ENCOUNTER — Ambulatory Visit (INDEPENDENT_AMBULATORY_CARE_PROVIDER_SITE_OTHER): Payer: Medicare Other | Admitting: Podiatry

## 2019-12-23 ENCOUNTER — Other Ambulatory Visit: Payer: Self-pay

## 2019-12-23 DIAGNOSIS — M2011 Hallux valgus (acquired), right foot: Secondary | ICD-10-CM

## 2019-12-23 DIAGNOSIS — M2041 Other hammer toe(s) (acquired), right foot: Secondary | ICD-10-CM

## 2019-12-23 DIAGNOSIS — L97511 Non-pressure chronic ulcer of other part of right foot limited to breakdown of skin: Secondary | ICD-10-CM | POA: Diagnosis not present

## 2019-12-23 DIAGNOSIS — M2012 Hallux valgus (acquired), left foot: Secondary | ICD-10-CM | POA: Diagnosis not present

## 2019-12-23 DIAGNOSIS — L84 Corns and callosities: Secondary | ICD-10-CM

## 2019-12-23 MED ORDER — MUPIROCIN 2 % EX OINT: 1.0000 "application " | TOPICAL_OINTMENT | Freq: Two times a day (BID) | CUTANEOUS | 0 refills | Status: DC

## 2019-12-23 MED ORDER — CICLOPIROX 8 % EX SOLN: Freq: Every day | CUTANEOUS | 0 refills | Status: DC

## 2019-12-27 DIAGNOSIS — E1169 Type 2 diabetes mellitus with other specified complication: Secondary | ICD-10-CM | POA: Diagnosis not present

## 2019-12-27 DIAGNOSIS — M8580 Other specified disorders of bone density and structure, unspecified site: Secondary | ICD-10-CM | POA: Diagnosis not present

## 2019-12-27 DIAGNOSIS — I10 Essential (primary) hypertension: Secondary | ICD-10-CM | POA: Diagnosis not present

## 2019-12-27 DIAGNOSIS — E039 Hypothyroidism, unspecified: Secondary | ICD-10-CM | POA: Diagnosis not present

## 2019-12-27 DIAGNOSIS — G8929 Other chronic pain: Secondary | ICD-10-CM | POA: Diagnosis not present

## 2019-12-27 DIAGNOSIS — F325 Major depressive disorder, single episode, in full remission: Secondary | ICD-10-CM | POA: Diagnosis not present

## 2019-12-31 ENCOUNTER — Ambulatory Visit: Payer: Medicare Other | Admitting: Physical Therapy

## 2019-12-31 NOTE — Progress Notes (Signed)
Subjective: 86 year old female presents the office today for concerns of a painful corn callus on the right foot submetatarsal 5.  She also has thick, discolored toenails but denies any redness or drainage or swelling to the toenail sites.  She is previously been seen by Dr. Valentina Lucks for corns and calluses. Denies any systemic complaints such as fevers, chills, nausea, vomiting. No acute changes since last appointment, and no other complaints at this time.   Objective: AAO x3, NAD DP/PT pulses palpable bilaterally, CRT less than 3 seconds Nails are hypertrophic, dystrophic with yellow-brown discoloration.  No hyperpigmented changes.  Hyperkeratotic lesions present bilaterally to the medial hallux submetatarsal 5 as well as to the right submetatarsal 1 and right fourth digit.  Upon debridement there is a superficial wound present on the left second toe from a hyperkeratotic lesion as well.  There is no drainage or pus, edema, erythema.  There is no fluctuation or crepitation.  Is no malodor Bunion, hammertoe deformities present. No pain with calf compression, swelling, warmth, erythema  Assessment: Superficial wound left foot, onychomycosis, calluses  Plan: -All treatment options discussed with the patient including all alternatives, risks, complications.  -I debrided the hyperkeratotic lesions without any complications or bleeding.  However on the second toe there was a superficial wound present.  Mupirocin ointment was prescribed.  Offloading.  Monitoring signs or symptoms of infection -Check [place] for toenail fungus.  Nails debrided the complications or bleeding -Patient encouraged to call the office with any questions, concerns, change in symptoms.   Trula Slade DPM

## 2020-01-02 ENCOUNTER — Ambulatory Visit: Payer: Medicare Other | Admitting: Podiatry

## 2020-01-03 ENCOUNTER — Other Ambulatory Visit: Payer: Self-pay

## 2020-01-03 ENCOUNTER — Ambulatory Visit (INDEPENDENT_AMBULATORY_CARE_PROVIDER_SITE_OTHER): Payer: Medicare Other | Admitting: Podiatry

## 2020-01-03 DIAGNOSIS — M2012 Hallux valgus (acquired), left foot: Secondary | ICD-10-CM | POA: Diagnosis not present

## 2020-01-03 DIAGNOSIS — L84 Corns and callosities: Secondary | ICD-10-CM

## 2020-01-03 DIAGNOSIS — M2041 Other hammer toe(s) (acquired), right foot: Secondary | ICD-10-CM

## 2020-01-03 DIAGNOSIS — M2011 Hallux valgus (acquired), right foot: Secondary | ICD-10-CM | POA: Diagnosis not present

## 2020-01-03 DIAGNOSIS — L97511 Non-pressure chronic ulcer of other part of right foot limited to breakdown of skin: Secondary | ICD-10-CM | POA: Diagnosis not present

## 2020-01-07 NOTE — Progress Notes (Signed)
  Subjective:  Patient ID: [name], female    DOB: 05-26-1932,  MRN: [id number]  Chief Complaint  Patient presents with  . Wound Check    PT has a small open area to the 2nd toe on the left foot that is causing her alot of pain.    86 y.o. female presents with the above complaint. History confirmed with patient.  She saw Dr. Jacqualyn Posey on 12/23/2019 for sore on the second toe on the right foot.  She was concerned about infection came for evaluation.  Objective:  Physical Exam: warm, good capillary refill, no trophic changes or ulcerative lesions, normal DP and PT pulses and normal sensory exam.   Right Foot: Hallux valgus and hammertoe contractures are present with a partial thickness ulceration on the second digit PIPJ, no cellulitis, no malodor or purulence.  Minimal drainage.  Assessment:  No diagnosis found.   Plan:  Patient was evaluated and treated and all questions answered.   -Recommend she continue with local wound care for now with mupirocin ointment -I dispensed a silicone toe protector for the hallux to prevent irritation and rubbing of this against the second toe -I discussed with her that if the local wound care measures fail that could consider correction of the hammertoe deformity to remove the ulcer and pressure areas.  Return in about 11 days (around 01/14/2020).

## 2020-01-07 NOTE — Addendum Note (Signed)
Addended bySherryle Lis, Briseis Aguilera R on: 01/07/2020 07:31 AM   Modules accepted: Level of Service

## 2020-01-13 ENCOUNTER — Ambulatory Visit: Payer: Medicare Other | Admitting: Podiatry

## 2020-01-13 ENCOUNTER — Ambulatory Visit (INDEPENDENT_AMBULATORY_CARE_PROVIDER_SITE_OTHER): Payer: Medicare Other | Admitting: Podiatry

## 2020-01-13 ENCOUNTER — Other Ambulatory Visit: Payer: Self-pay

## 2020-01-13 DIAGNOSIS — L84 Corns and callosities: Secondary | ICD-10-CM | POA: Diagnosis not present

## 2020-01-13 DIAGNOSIS — M2011 Hallux valgus (acquired), right foot: Secondary | ICD-10-CM

## 2020-01-13 DIAGNOSIS — M2041 Other hammer toe(s) (acquired), right foot: Secondary | ICD-10-CM

## 2020-01-13 DIAGNOSIS — M2012 Hallux valgus (acquired), left foot: Secondary | ICD-10-CM | POA: Diagnosis not present

## 2020-01-20 NOTE — Progress Notes (Signed)
Subjective: 86 year old female presents the office today for follow-up evaluation of a skin lesion on her left second toe.  She states the toe is still "sick".  Denies any drainage or pus any swelling or redness of the toe.  No recent injury or falls or changes otherwise since I last saw her. Denies any systemic complaints such as fevers, chills, nausea, vomiting. No acute changes since last appointment, and no other complaints at this time.   Objective: AAO x3, NAD DP/PT pulses palpable bilaterally, CRT less than 3 seconds Hammertoe, bunion deformities are present.  Hyperkeratotic lesion on the medial aspect the second PIPJ but there is no ongoing ulceration drainage or signs of infection noted today.  There is no edema, erythema to the digit. No pain with calf compression, swelling, warmth, erythema  Assessment: Digital deformity resulting in preulcerative callus second toe  Plan: -All treatment options discussed with the patient including all alternatives, risks, complications.  -Debrided the hyperkeratotic tissue with any complications.  The wound appears to be healed however we discussed offloading pads to help prevent skin breakdown and help with her discomfort which I dispensed today.  Discussed shoe modifications as well. -Patient encouraged to call the office with any questions, concerns, change in symptoms.   Trula Slade DPM

## 2020-02-10 DIAGNOSIS — R110 Nausea: Secondary | ICD-10-CM | POA: Diagnosis not present

## 2020-02-10 DIAGNOSIS — R269 Unspecified abnormalities of gait and mobility: Secondary | ICD-10-CM | POA: Diagnosis not present

## 2020-02-10 DIAGNOSIS — R197 Diarrhea, unspecified: Secondary | ICD-10-CM | POA: Diagnosis not present

## 2020-02-10 DIAGNOSIS — Z79899 Other long term (current) drug therapy: Secondary | ICD-10-CM | POA: Diagnosis not present

## 2020-02-10 DIAGNOSIS — I10 Essential (primary) hypertension: Secondary | ICD-10-CM | POA: Diagnosis not present

## 2020-02-11 DIAGNOSIS — F325 Major depressive disorder, single episode, in full remission: Secondary | ICD-10-CM | POA: Diagnosis not present

## 2020-02-11 DIAGNOSIS — G8929 Other chronic pain: Secondary | ICD-10-CM | POA: Diagnosis not present

## 2020-02-11 DIAGNOSIS — E039 Hypothyroidism, unspecified: Secondary | ICD-10-CM | POA: Diagnosis not present

## 2020-02-11 DIAGNOSIS — E1169 Type 2 diabetes mellitus with other specified complication: Secondary | ICD-10-CM | POA: Diagnosis not present

## 2020-02-11 DIAGNOSIS — I10 Essential (primary) hypertension: Secondary | ICD-10-CM | POA: Diagnosis not present

## 2020-02-11 DIAGNOSIS — M8580 Other specified disorders of bone density and structure, unspecified site: Secondary | ICD-10-CM | POA: Diagnosis not present

## 2020-02-11 DIAGNOSIS — D509 Iron deficiency anemia, unspecified: Secondary | ICD-10-CM | POA: Diagnosis not present

## 2020-02-25 DIAGNOSIS — R110 Nausea: Secondary | ICD-10-CM | POA: Diagnosis not present

## 2020-03-04 ENCOUNTER — Ambulatory Visit
Admission: RE | Admit: 2020-03-04 | Discharge: 2020-03-04 | Disposition: A | Discharge status: Home / Self Care | Payer: Medicare Other | Source: Ambulatory Visit | Attending: Geriatric Medicine | Admitting: Geriatric Medicine

## 2020-03-04 ENCOUNTER — Other Ambulatory Visit: Payer: Self-pay | Admitting: Geriatric Medicine

## 2020-03-04 DIAGNOSIS — J3489 Other specified disorders of nose and nasal sinuses: Secondary | ICD-10-CM

## 2020-03-04 DIAGNOSIS — I700 Atherosclerosis of aorta: Secondary | ICD-10-CM | POA: Diagnosis not present

## 2020-03-04 DIAGNOSIS — K8689 Other specified diseases of pancreas: Secondary | ICD-10-CM | POA: Diagnosis not present

## 2020-03-04 DIAGNOSIS — K2100 Gastro-esophageal reflux disease with esophagitis, without bleeding: Secondary | ICD-10-CM | POA: Diagnosis not present

## 2020-03-04 DIAGNOSIS — I10 Essential (primary) hypertension: Secondary | ICD-10-CM | POA: Diagnosis not present

## 2020-03-04 DIAGNOSIS — Z79899 Other long term (current) drug therapy: Secondary | ICD-10-CM | POA: Diagnosis not present

## 2020-03-04 DIAGNOSIS — Z8507 Personal history of malignant neoplasm of pancreas: Secondary | ICD-10-CM | POA: Diagnosis not present

## 2020-03-04 DIAGNOSIS — E1169 Type 2 diabetes mellitus with other specified complication: Secondary | ICD-10-CM | POA: Diagnosis not present

## 2020-03-05 ENCOUNTER — Other Ambulatory Visit: Payer: Self-pay | Admitting: Physician Assistant

## 2020-03-05 DIAGNOSIS — R1013 Epigastric pain: Secondary | ICD-10-CM

## 2020-03-05 DIAGNOSIS — Z8711 Personal history of peptic ulcer disease: Secondary | ICD-10-CM | POA: Diagnosis not present

## 2020-03-05 DIAGNOSIS — D509 Iron deficiency anemia, unspecified: Secondary | ICD-10-CM | POA: Diagnosis not present

## 2020-03-05 DIAGNOSIS — Z8507 Personal history of malignant neoplasm of pancreas: Secondary | ICD-10-CM | POA: Diagnosis not present

## 2020-03-19 ENCOUNTER — Other Ambulatory Visit: Payer: Self-pay

## 2020-03-19 ENCOUNTER — Other Ambulatory Visit: Payer: Medicare Other

## 2020-03-19 ENCOUNTER — Ambulatory Visit
Admission: RE | Admit: 2020-03-19 | Discharge: 2020-03-19 | Disposition: A | Discharge status: Home / Self Care | Payer: Medicare Other | Source: Ambulatory Visit | Attending: Physician Assistant | Admitting: Physician Assistant

## 2020-03-19 DIAGNOSIS — N281 Cyst of kidney, acquired: Secondary | ICD-10-CM | POA: Diagnosis not present

## 2020-03-19 DIAGNOSIS — R1013 Epigastric pain: Secondary | ICD-10-CM

## 2020-03-19 DIAGNOSIS — K5730 Diverticulosis of large intestine without perforation or abscess without bleeding: Secondary | ICD-10-CM | POA: Diagnosis not present

## 2020-03-19 DIAGNOSIS — R109 Unspecified abdominal pain: Secondary | ICD-10-CM | POA: Diagnosis not present

## 2020-03-31 DIAGNOSIS — K2100 Gastro-esophageal reflux disease with esophagitis, without bleeding: Secondary | ICD-10-CM | POA: Diagnosis not present

## 2020-03-31 DIAGNOSIS — Z8507 Personal history of malignant neoplasm of pancreas: Secondary | ICD-10-CM | POA: Diagnosis not present

## 2020-03-31 DIAGNOSIS — R197 Diarrhea, unspecified: Secondary | ICD-10-CM | POA: Diagnosis not present

## 2020-03-31 DIAGNOSIS — K8689 Other specified diseases of pancreas: Secondary | ICD-10-CM | POA: Diagnosis not present

## 2020-04-01 ENCOUNTER — Other Ambulatory Visit: Payer: Self-pay | Admitting: Podiatry

## 2020-04-01 DIAGNOSIS — K8689 Other specified diseases of pancreas: Secondary | ICD-10-CM | POA: Diagnosis not present

## 2020-04-01 DIAGNOSIS — Z23 Encounter for immunization: Secondary | ICD-10-CM | POA: Diagnosis not present

## 2020-04-02 NOTE — Telephone Encounter (Signed)
Please advise 

## 2020-04-13 ENCOUNTER — Ambulatory Visit: Payer: Medicare Other | Admitting: Podiatry

## 2020-04-30 DIAGNOSIS — N3941 Urge incontinence: Secondary | ICD-10-CM | POA: Diagnosis not present

## 2020-04-30 DIAGNOSIS — R351 Nocturia: Secondary | ICD-10-CM | POA: Diagnosis not present

## 2020-04-30 DIAGNOSIS — R350 Frequency of micturition: Secondary | ICD-10-CM | POA: Diagnosis not present

## 2020-05-04 ENCOUNTER — Ambulatory Visit (INDEPENDENT_AMBULATORY_CARE_PROVIDER_SITE_OTHER): Payer: Medicare Other | Admitting: Otolaryngology

## 2020-05-05 DIAGNOSIS — G8929 Other chronic pain: Secondary | ICD-10-CM | POA: Diagnosis not present

## 2020-05-05 DIAGNOSIS — F325 Major depressive disorder, single episode, in full remission: Secondary | ICD-10-CM | POA: Diagnosis not present

## 2020-05-05 DIAGNOSIS — I700 Atherosclerosis of aorta: Secondary | ICD-10-CM | POA: Diagnosis not present

## 2020-05-05 DIAGNOSIS — E039 Hypothyroidism, unspecified: Secondary | ICD-10-CM | POA: Diagnosis not present

## 2020-05-05 DIAGNOSIS — E1169 Type 2 diabetes mellitus with other specified complication: Secondary | ICD-10-CM | POA: Diagnosis not present

## 2020-05-05 DIAGNOSIS — D509 Iron deficiency anemia, unspecified: Secondary | ICD-10-CM | POA: Diagnosis not present

## 2020-05-05 DIAGNOSIS — M8580 Other specified disorders of bone density and structure, unspecified site: Secondary | ICD-10-CM | POA: Diagnosis not present

## 2020-05-05 DIAGNOSIS — I10 Essential (primary) hypertension: Secondary | ICD-10-CM | POA: Diagnosis not present

## 2020-05-05 DIAGNOSIS — D508 Other iron deficiency anemias: Secondary | ICD-10-CM | POA: Diagnosis not present

## 2020-05-12 DIAGNOSIS — R197 Diarrhea, unspecified: Secondary | ICD-10-CM | POA: Diagnosis not present

## 2020-05-12 DIAGNOSIS — Z90411 Acquired partial absence of pancreas: Secondary | ICD-10-CM | POA: Diagnosis not present

## 2020-05-12 DIAGNOSIS — Z9049 Acquired absence of other specified parts of digestive tract: Secondary | ICD-10-CM | POA: Diagnosis not present

## 2020-05-12 DIAGNOSIS — Q859 Phakomatosis, unspecified: Secondary | ICD-10-CM | POA: Diagnosis not present

## 2020-05-12 DIAGNOSIS — C250 Malignant neoplasm of head of pancreas: Secondary | ICD-10-CM | POA: Diagnosis not present

## 2020-05-12 DIAGNOSIS — Z681 Body mass index (BMI) 19 or less, adult: Secondary | ICD-10-CM | POA: Diagnosis not present

## 2020-05-12 DIAGNOSIS — R1013 Epigastric pain: Secondary | ICD-10-CM | POA: Diagnosis not present

## 2020-05-12 DIAGNOSIS — R634 Abnormal weight loss: Secondary | ICD-10-CM | POA: Diagnosis not present

## 2020-05-12 DIAGNOSIS — Z79899 Other long term (current) drug therapy: Secondary | ICD-10-CM | POA: Diagnosis not present

## 2020-05-12 DIAGNOSIS — Z8507 Personal history of malignant neoplasm of pancreas: Secondary | ICD-10-CM | POA: Diagnosis not present

## 2020-05-12 DIAGNOSIS — Z08 Encounter for follow-up examination after completed treatment for malignant neoplasm: Secondary | ICD-10-CM | POA: Diagnosis not present

## 2020-05-19 ENCOUNTER — Ambulatory Visit: Payer: Medicare Other | Admitting: Podiatry

## 2020-05-23 ENCOUNTER — Ambulatory Visit: Payer: Medicare Other | Admitting: Podiatry

## 2020-05-25 DIAGNOSIS — R350 Frequency of micturition: Secondary | ICD-10-CM | POA: Diagnosis not present

## 2020-05-25 DIAGNOSIS — R351 Nocturia: Secondary | ICD-10-CM | POA: Diagnosis not present

## 2020-05-25 DIAGNOSIS — R8271 Bacteriuria: Secondary | ICD-10-CM | POA: Diagnosis not present

## 2020-05-25 DIAGNOSIS — N3941 Urge incontinence: Secondary | ICD-10-CM | POA: Diagnosis not present

## 2020-06-08 ENCOUNTER — Other Ambulatory Visit: Payer: Self-pay | Admitting: Physician Assistant

## 2020-06-08 DIAGNOSIS — R1013 Epigastric pain: Secondary | ICD-10-CM

## 2020-06-08 DIAGNOSIS — K8689 Other specified diseases of pancreas: Secondary | ICD-10-CM | POA: Diagnosis not present

## 2020-06-08 DIAGNOSIS — R197 Diarrhea, unspecified: Secondary | ICD-10-CM | POA: Diagnosis not present

## 2020-06-08 DIAGNOSIS — F4321 Adjustment disorder with depressed mood: Secondary | ICD-10-CM | POA: Diagnosis not present

## 2020-06-09 ENCOUNTER — Other Ambulatory Visit: Payer: Self-pay

## 2020-06-09 ENCOUNTER — Ambulatory Visit (INDEPENDENT_AMBULATORY_CARE_PROVIDER_SITE_OTHER): Payer: Medicare Other | Admitting: Podiatry

## 2020-06-09 DIAGNOSIS — M7742 Metatarsalgia, left foot: Secondary | ICD-10-CM

## 2020-06-09 DIAGNOSIS — M216X2 Other acquired deformities of left foot: Secondary | ICD-10-CM | POA: Diagnosis not present

## 2020-06-09 DIAGNOSIS — M2042 Other hammer toe(s) (acquired), left foot: Secondary | ICD-10-CM | POA: Diagnosis not present

## 2020-06-09 DIAGNOSIS — L84 Corns and callosities: Secondary | ICD-10-CM | POA: Diagnosis not present

## 2020-06-09 DIAGNOSIS — R197 Diarrhea, unspecified: Secondary | ICD-10-CM | POA: Diagnosis not present

## 2020-06-12 NOTE — Progress Notes (Signed)
Subjective: 87 year old female presents the office today for concerns of painful calluses mostly in her left foot.  She gets a painful callus on the medial aspect of the second toe but also started get pain in the ball of her foot on the left side.  Denies any open lesions.  No swelling.  No recent injury or changes otherwise. Denies any systemic complaints such as fevers, chills, nausea, vomiting. No acute changes since last appointment, and no other complaints at this time.   Objective: AAO x3, NAD DP/PT pulses palpable bilaterally, CRT less than 3 seconds Hyperkeratotic lesion on the medial aspect left second toe and upon debridement there is no underlying ulceration drainage or any signs of infection.  Digital deformities noted.  Tenderness submetatarsal 2.  No pain the dorsal metatarsal.  There is prominence of metatarsal heads plantarly with atrophy of the fat pad.  No pain with MPJ range of motion.  No other areas of discomfort.  MMT 5/5.  No pain with calf compression, erythema or warmth.    Assessment: 87 year old female with hyperkeratotic lesion left foot, metatarsalgia  Plan: -All treatment options discussed with the patient including all alternatives, risks, complications.  -Debrided hyperkeratotic lesion x1 without any complications or bleeding.  Continue offloading.  Discussed likely reoccurrence given the digital deformity. -Discussed offloading for the prominent metatarsal heads which I think is causing the capsulitis, discomfort.  Metatarsal pads dispensed.  Discussed shoe modifications. -Patient encouraged to call the office with any questions, concerns, change in symptoms.   Trula Slade DPM

## 2020-06-19 DIAGNOSIS — D649 Anemia, unspecified: Secondary | ICD-10-CM | POA: Diagnosis not present

## 2020-06-19 DIAGNOSIS — E538 Deficiency of other specified B group vitamins: Secondary | ICD-10-CM | POA: Diagnosis not present

## 2020-06-19 DIAGNOSIS — D509 Iron deficiency anemia, unspecified: Secondary | ICD-10-CM | POA: Diagnosis not present

## 2020-06-26 DIAGNOSIS — I10 Essential (primary) hypertension: Secondary | ICD-10-CM | POA: Diagnosis not present

## 2020-06-26 DIAGNOSIS — F325 Major depressive disorder, single episode, in full remission: Secondary | ICD-10-CM | POA: Diagnosis not present

## 2020-06-26 DIAGNOSIS — D509 Iron deficiency anemia, unspecified: Secondary | ICD-10-CM | POA: Diagnosis not present

## 2020-06-26 DIAGNOSIS — G8929 Other chronic pain: Secondary | ICD-10-CM | POA: Diagnosis not present

## 2020-06-26 DIAGNOSIS — M8580 Other specified disorders of bone density and structure, unspecified site: Secondary | ICD-10-CM | POA: Diagnosis not present

## 2020-06-26 DIAGNOSIS — D508 Other iron deficiency anemias: Secondary | ICD-10-CM | POA: Diagnosis not present

## 2020-06-26 DIAGNOSIS — E1169 Type 2 diabetes mellitus with other specified complication: Secondary | ICD-10-CM | POA: Diagnosis not present

## 2020-06-26 DIAGNOSIS — F4321 Adjustment disorder with depressed mood: Secondary | ICD-10-CM | POA: Diagnosis not present

## 2020-06-26 DIAGNOSIS — E039 Hypothyroidism, unspecified: Secondary | ICD-10-CM | POA: Diagnosis not present

## 2020-07-03 ENCOUNTER — Other Ambulatory Visit: Payer: Self-pay

## 2020-07-03 ENCOUNTER — Ambulatory Visit
Admission: RE | Admit: 2020-07-03 | Discharge: 2020-07-03 | Disposition: A | Discharge status: Home / Self Care | Payer: Medicare Other | Source: Ambulatory Visit | Attending: Physician Assistant | Admitting: Physician Assistant

## 2020-07-03 DIAGNOSIS — R1013 Epigastric pain: Secondary | ICD-10-CM

## 2020-07-03 DIAGNOSIS — K30 Functional dyspepsia: Secondary | ICD-10-CM | POA: Diagnosis not present

## 2020-07-16 DIAGNOSIS — N3000 Acute cystitis without hematuria: Secondary | ICD-10-CM | POA: Diagnosis not present

## 2020-07-16 DIAGNOSIS — R3915 Urgency of urination: Secondary | ICD-10-CM | POA: Diagnosis not present

## 2020-07-16 DIAGNOSIS — R8271 Bacteriuria: Secondary | ICD-10-CM | POA: Diagnosis not present

## 2020-07-16 DIAGNOSIS — R350 Frequency of micturition: Secondary | ICD-10-CM | POA: Diagnosis not present

## 2020-07-20 DIAGNOSIS — R32 Unspecified urinary incontinence: Secondary | ICD-10-CM | POA: Diagnosis not present

## 2020-07-20 DIAGNOSIS — I10 Essential (primary) hypertension: Secondary | ICD-10-CM | POA: Diagnosis not present

## 2020-07-20 DIAGNOSIS — E46 Unspecified protein-calorie malnutrition: Secondary | ICD-10-CM | POA: Diagnosis not present

## 2020-07-20 DIAGNOSIS — Z8507 Personal history of malignant neoplasm of pancreas: Secondary | ICD-10-CM | POA: Diagnosis not present

## 2020-08-04 DIAGNOSIS — R300 Dysuria: Secondary | ICD-10-CM | POA: Diagnosis not present

## 2020-08-12 DIAGNOSIS — G8929 Other chronic pain: Secondary | ICD-10-CM | POA: Diagnosis not present

## 2020-08-12 DIAGNOSIS — F4321 Adjustment disorder with depressed mood: Secondary | ICD-10-CM | POA: Diagnosis not present

## 2020-08-12 DIAGNOSIS — D508 Other iron deficiency anemias: Secondary | ICD-10-CM | POA: Diagnosis not present

## 2020-08-12 DIAGNOSIS — M8580 Other specified disorders of bone density and structure, unspecified site: Secondary | ICD-10-CM | POA: Diagnosis not present

## 2020-08-12 DIAGNOSIS — E1169 Type 2 diabetes mellitus with other specified complication: Secondary | ICD-10-CM | POA: Diagnosis not present

## 2020-08-12 DIAGNOSIS — D509 Iron deficiency anemia, unspecified: Secondary | ICD-10-CM | POA: Diagnosis not present

## 2020-08-12 DIAGNOSIS — F325 Major depressive disorder, single episode, in full remission: Secondary | ICD-10-CM | POA: Diagnosis not present

## 2020-08-12 DIAGNOSIS — I10 Essential (primary) hypertension: Secondary | ICD-10-CM | POA: Diagnosis not present

## 2020-08-12 DIAGNOSIS — E039 Hypothyroidism, unspecified: Secondary | ICD-10-CM | POA: Diagnosis not present

## 2020-08-14 DIAGNOSIS — Z9049 Acquired absence of other specified parts of digestive tract: Secondary | ICD-10-CM | POA: Diagnosis not present

## 2020-08-14 DIAGNOSIS — C250 Malignant neoplasm of head of pancreas: Secondary | ICD-10-CM | POA: Diagnosis not present

## 2020-08-14 DIAGNOSIS — K8681 Exocrine pancreatic insufficiency: Secondary | ICD-10-CM | POA: Diagnosis not present

## 2020-08-17 DIAGNOSIS — K449 Diaphragmatic hernia without obstruction or gangrene: Secondary | ICD-10-CM | POA: Diagnosis not present

## 2020-08-17 DIAGNOSIS — K2950 Unspecified chronic gastritis without bleeding: Secondary | ICD-10-CM | POA: Diagnosis not present

## 2020-08-17 DIAGNOSIS — R1013 Epigastric pain: Secondary | ICD-10-CM | POA: Diagnosis not present

## 2020-08-17 DIAGNOSIS — Z980 Intestinal bypass and anastomosis status: Secondary | ICD-10-CM | POA: Diagnosis not present

## 2020-09-10 ENCOUNTER — Ambulatory Visit: Payer: Medicare Other | Admitting: Podiatry

## 2020-09-11 DIAGNOSIS — K8689 Other specified diseases of pancreas: Secondary | ICD-10-CM | POA: Diagnosis not present

## 2020-09-11 DIAGNOSIS — E46 Unspecified protein-calorie malnutrition: Secondary | ICD-10-CM | POA: Diagnosis not present

## 2020-09-11 DIAGNOSIS — Z1389 Encounter for screening for other disorder: Secondary | ICD-10-CM | POA: Diagnosis not present

## 2020-09-11 DIAGNOSIS — Z0000 Encounter for general adult medical examination without abnormal findings: Secondary | ICD-10-CM | POA: Diagnosis not present

## 2020-09-11 DIAGNOSIS — K219 Gastro-esophageal reflux disease without esophagitis: Secondary | ICD-10-CM | POA: Diagnosis not present

## 2020-09-11 DIAGNOSIS — Z79899 Other long term (current) drug therapy: Secondary | ICD-10-CM | POA: Diagnosis not present

## 2020-09-11 DIAGNOSIS — K9089 Other intestinal malabsorption: Secondary | ICD-10-CM | POA: Diagnosis not present

## 2020-09-11 DIAGNOSIS — Z8507 Personal history of malignant neoplasm of pancreas: Secondary | ICD-10-CM | POA: Diagnosis not present

## 2020-09-11 DIAGNOSIS — I10 Essential (primary) hypertension: Secondary | ICD-10-CM | POA: Diagnosis not present

## 2020-09-11 DIAGNOSIS — R269 Unspecified abnormalities of gait and mobility: Secondary | ICD-10-CM | POA: Diagnosis not present

## 2020-09-18 DIAGNOSIS — H04123 Dry eye syndrome of bilateral lacrimal glands: Secondary | ICD-10-CM | POA: Diagnosis not present

## 2020-09-18 DIAGNOSIS — H40013 Open angle with borderline findings, low risk, bilateral: Secondary | ICD-10-CM | POA: Diagnosis not present

## 2020-09-18 DIAGNOSIS — H52203 Unspecified astigmatism, bilateral: Secondary | ICD-10-CM | POA: Diagnosis not present

## 2020-09-25 DIAGNOSIS — G8929 Other chronic pain: Secondary | ICD-10-CM | POA: Diagnosis not present

## 2020-09-25 DIAGNOSIS — M8580 Other specified disorders of bone density and structure, unspecified site: Secondary | ICD-10-CM | POA: Diagnosis not present

## 2020-09-25 DIAGNOSIS — F325 Major depressive disorder, single episode, in full remission: Secondary | ICD-10-CM | POA: Diagnosis not present

## 2020-09-25 DIAGNOSIS — F4321 Adjustment disorder with depressed mood: Secondary | ICD-10-CM | POA: Diagnosis not present

## 2020-09-25 DIAGNOSIS — K219 Gastro-esophageal reflux disease without esophagitis: Secondary | ICD-10-CM | POA: Diagnosis not present

## 2020-09-25 DIAGNOSIS — I10 Essential (primary) hypertension: Secondary | ICD-10-CM | POA: Diagnosis not present

## 2020-09-25 DIAGNOSIS — E1169 Type 2 diabetes mellitus with other specified complication: Secondary | ICD-10-CM | POA: Diagnosis not present

## 2020-09-25 DIAGNOSIS — E039 Hypothyroidism, unspecified: Secondary | ICD-10-CM | POA: Diagnosis not present

## 2020-10-01 ENCOUNTER — Ambulatory Visit (INDEPENDENT_AMBULATORY_CARE_PROVIDER_SITE_OTHER): Payer: Medicare Other

## 2020-10-01 ENCOUNTER — Ambulatory Visit (INDEPENDENT_AMBULATORY_CARE_PROVIDER_SITE_OTHER): Payer: Medicare Other | Admitting: Podiatry

## 2020-10-01 ENCOUNTER — Other Ambulatory Visit: Payer: Self-pay

## 2020-10-01 DIAGNOSIS — M7742 Metatarsalgia, left foot: Secondary | ICD-10-CM | POA: Diagnosis not present

## 2020-10-01 DIAGNOSIS — M778 Other enthesopathies, not elsewhere classified: Secondary | ICD-10-CM

## 2020-10-01 DIAGNOSIS — L84 Corns and callosities: Secondary | ICD-10-CM

## 2020-10-01 DIAGNOSIS — M2042 Other hammer toe(s) (acquired), left foot: Secondary | ICD-10-CM

## 2020-10-01 NOTE — Progress Notes (Signed)
Subjective: 87 year old female presents the office today for concerns of painful calluses mostly in her left foot.  She states the area has become very tender.  She also describes pain to the ball of her foot on the left side pointing to submetatarsal 2 only when she walks.  This is been ongoing for quite some time but seems to be getting worse.  No recent injury.  No swelling.  No other concerns today.  Objective: AAO x3, NAD DP/PT pulses palpable bilaterally, CRT less than 3 seconds Hyperkeratotic lesion on the medial aspect left second toe and upon debridement there is no underlying ulceration drainage or any signs of infection.  Bunion, hammertoe deformities are noted.  Prominence of metatarsal heads plantarly with atrophy of the fat pad.  Not able to elicit any area of pinpoint tenderness today she says only with walking.  Assessment: 87 year old female with hyperkeratotic lesion left foot, metatarsalgia  Plan: -All treatment options discussed with the patient including all alternatives, risks, complications.  -Debrided hyperkeratotic lesion x1 without any complications or bleeding.  Continue offloading.  Discussed likely reoccurrence given the digital deformity. -Discussed gel metatarsal pads. -Patient encouraged to call the office with any questions, concerns, change in symptoms.   Trula Slade DPM

## 2020-10-08 ENCOUNTER — Other Ambulatory Visit: Payer: Self-pay | Admitting: Podiatry

## 2020-10-08 DIAGNOSIS — M7742 Metatarsalgia, left foot: Secondary | ICD-10-CM

## 2020-10-09 DIAGNOSIS — N83201 Unspecified ovarian cyst, right side: Secondary | ICD-10-CM | POA: Diagnosis not present

## 2020-10-09 DIAGNOSIS — Z681 Body mass index (BMI) 19 or less, adult: Secondary | ICD-10-CM | POA: Diagnosis not present

## 2020-10-09 DIAGNOSIS — Z01411 Encounter for gynecological examination (general) (routine) with abnormal findings: Secondary | ICD-10-CM | POA: Diagnosis not present

## 2020-10-09 DIAGNOSIS — Z124 Encounter for screening for malignant neoplasm of cervix: Secondary | ICD-10-CM | POA: Diagnosis not present

## 2020-10-09 DIAGNOSIS — Z01419 Encounter for gynecological examination (general) (routine) without abnormal findings: Secondary | ICD-10-CM | POA: Diagnosis not present

## 2020-10-09 DIAGNOSIS — Z1231 Encounter for screening mammogram for malignant neoplasm of breast: Secondary | ICD-10-CM | POA: Diagnosis not present

## 2020-10-19 DIAGNOSIS — Z23 Encounter for immunization: Secondary | ICD-10-CM | POA: Diagnosis not present

## 2020-10-20 DIAGNOSIS — E1169 Type 2 diabetes mellitus with other specified complication: Secondary | ICD-10-CM | POA: Diagnosis not present

## 2020-10-20 DIAGNOSIS — D509 Iron deficiency anemia, unspecified: Secondary | ICD-10-CM | POA: Diagnosis not present

## 2020-10-20 DIAGNOSIS — D508 Other iron deficiency anemias: Secondary | ICD-10-CM | POA: Diagnosis not present

## 2020-10-20 DIAGNOSIS — K219 Gastro-esophageal reflux disease without esophagitis: Secondary | ICD-10-CM | POA: Diagnosis not present

## 2020-10-20 DIAGNOSIS — F4321 Adjustment disorder with depressed mood: Secondary | ICD-10-CM | POA: Diagnosis not present

## 2020-10-20 DIAGNOSIS — G8929 Other chronic pain: Secondary | ICD-10-CM | POA: Diagnosis not present

## 2020-10-20 DIAGNOSIS — I10 Essential (primary) hypertension: Secondary | ICD-10-CM | POA: Diagnosis not present

## 2020-10-20 DIAGNOSIS — M8580 Other specified disorders of bone density and structure, unspecified site: Secondary | ICD-10-CM | POA: Diagnosis not present

## 2020-10-20 DIAGNOSIS — E039 Hypothyroidism, unspecified: Secondary | ICD-10-CM | POA: Diagnosis not present

## 2020-10-20 DIAGNOSIS — F325 Major depressive disorder, single episode, in full remission: Secondary | ICD-10-CM | POA: Diagnosis not present

## 2020-10-23 DIAGNOSIS — K6389 Other specified diseases of intestine: Secondary | ICD-10-CM | POA: Diagnosis not present

## 2020-10-23 DIAGNOSIS — R12 Heartburn: Secondary | ICD-10-CM | POA: Diagnosis not present

## 2020-10-23 DIAGNOSIS — R634 Abnormal weight loss: Secondary | ICD-10-CM | POA: Diagnosis not present

## 2020-10-23 DIAGNOSIS — R140 Abdominal distension (gaseous): Secondary | ICD-10-CM | POA: Diagnosis not present

## 2020-11-16 ENCOUNTER — Ambulatory Visit: Payer: Medicare Other | Admitting: Cardiology

## 2020-11-18 ENCOUNTER — Ambulatory Visit: Payer: Medicare Other | Admitting: Cardiology

## 2020-12-03 DIAGNOSIS — N83201 Unspecified ovarian cyst, right side: Secondary | ICD-10-CM | POA: Diagnosis not present

## 2020-12-04 DIAGNOSIS — I10 Essential (primary) hypertension: Secondary | ICD-10-CM | POA: Diagnosis not present

## 2020-12-04 DIAGNOSIS — Z79899 Other long term (current) drug therapy: Secondary | ICD-10-CM | POA: Diagnosis not present

## 2020-12-04 DIAGNOSIS — Z23 Encounter for immunization: Secondary | ICD-10-CM | POA: Diagnosis not present

## 2020-12-04 DIAGNOSIS — D509 Iron deficiency anemia, unspecified: Secondary | ICD-10-CM | POA: Diagnosis not present

## 2020-12-07 DIAGNOSIS — D508 Other iron deficiency anemias: Secondary | ICD-10-CM | POA: Diagnosis not present

## 2020-12-07 DIAGNOSIS — R634 Abnormal weight loss: Secondary | ICD-10-CM | POA: Diagnosis not present

## 2020-12-10 NOTE — Progress Notes (Signed)
Cardiology Office Note    Date:  12/11/2020   ID:  [name], [name] 11-Mar-1932, MRN [id number]  PCP:  Lajean Manes, MD  Cardiologist:  Candee Furbish, MD  Electrophysiologist:  None   Chief Complaint: f/u lower extremity edema  History of Present Illness:   [name] is a 87 y.o. female with history of lower extremity edema (previously treated with furosemide), HTN, remote pancreatic CA (s/p rad, chemo, targeted gene therapy, [facility]),  aortic atherosclerosis by CT 09/2018, HLD, osteopenia, hypothyroidism who presents for follow-up. She established care with Dr. Marlou Porch initially in 2015 for atypical chest pain. Nuc 10/2013 was normal, EF 77%. She also has a history of edema felt due to venous insufficiency, no prior echo on file.  She is seen back for follow-up doing well from a cardiac standpoint without any chest pain, SOB, palpitations, syncope, dizziness. She has been seeing GI for some digestion issues she's been experiencing since the death of her husband 1 year ago. She is on Creon supplements. She is trying to eat more frequent meals to increase her weight. She is no longer on antihypertensives or diuretics.   Labwork independently reviewed: [facility] 12/2020 Hgb 11, Cr 0.77, K 4.3, ALT wnl [facility] 08/2020 TSH wnl, Mg 2.1 CareEverywhere 05/2020 Hgb 11, plt wnl, CMET wnl [facility] 08/2019 Tchol 187, trig 87, trig 121, HDL 49 05/2019 TSH wnl   Past Medical History:  Diagnosis Date   Aortic valve sclerosis    C. difficile colitis    Hyperlipidemia    Hypothyroid    Mild recurrent major depression ([place])    Osteopenia    Pancreatic cancer ([place])    Pancreatic   Vertigo    Vitamin D deficiency     Past Surgical History:  Procedure Laterality Date   [place]   CATARACT EXTRACTION  2011   bilateral   CHOLECYSTECTOMY  1992   ESOPHAGOGASTRODUODENOSCOPY (EGD) WITH PROPOFOL N/A 09/06/2018   Procedure: ESOPHAGOGASTRODUODENOSCOPY (EGD) WITH PROPOFOL;  Surgeon: Wonda Horner, MD;  Location: [facility];  Service: Endoscopy;  Laterality: N/A;   lower back surgery  1994, 1995   L4-5   multiple hernias  2007, 2012   ventral   PARTIAL HYSTERECTOMY  1973   SHOULDER ARTHROSCOPY  2011   rt   TONSILLECTOMY  1942   WHIPPLE PROCEDURE  2006    Current Medications: Current Meds  Medication Sig   Ascorbic Acid (VITAMIN C) 1000 MG tablet Take 1,000 mg by mouth daily.   Cyanocobalamin (VITAMIN B 12) 100 MCG LOZG See admin instructions.   diclofenac sodium (VOLTAREN) 1 % GEL Apply 2 g topically daily as needed (pain).    Glycerin-Hypromellose-PEG 400 (DRY EYE RELIEF DROPS) 0.2-0.2-1 % SOLN Apply 1 drop to eye 2 (two) times daily.   lipase/protease/amylase (CREON) 36000 UNITS CPEP capsule Take by mouth. Per patient taking 3 tablets per meal,2 tablets per snack   Vitamin D, Ergocalciferol, (DRISDOL) 50000 UNITS CAPS Take 50,000 Units by mouth 2 (two) times a week. Every Wednesday and Saturday       Allergies:   Ivp dye [iodinated diagnostic agents], Amoxicillin, Ciprofloxacin, Dextromethorphan polistirex er, Diatrizoate, Diatrizoate meglumine & sodium, Flagyl [metronidazole], Hydrocodone, Latex, Methocarbamol, Methocarbamol, Oxycodone, Prednisone, Promethazine, Promethazine hcl, Sucralfate, and Tape   Social History   Socioeconomic History   Marital status: Married    Spouse name: Not on file   Number of children: Not on file   Years of education: Not on file  Highest education level: Not on file  Occupational History   Not on file  Tobacco Use   Smoking status: Never   Smokeless tobacco: Never  Vaping Use   Vaping Use: Never used  Substance and Sexual Activity   Alcohol use: Yes   Drug use: Never   Sexual activity: Not on file  Other Topics Concern   Not on file  Social History Narrative   Not on file   Social Determinants of Health   Financial Resource Strain: Not on file  Food Insecurity: Not on file  Transportation Needs: Not on file   Physical Activity: Not on file  Stress: Not on file  Social Connections: Not on file     Family History:  The patient's family history includes Breast cancer in her sister; Cancer in her maternal grandfather and sister; Heart attack in her father; Heart disease in her maternal grandmother and mother; Heart failure in her mother; Hypertension in her mother, sister, and sister.  ROS:   Please see the history of present illness.  All other systems are reviewed and otherwise negative.    EKGs/Labs/Other Studies Reviewed:    Studies reviewed are outlined and summarized above. Reports included below if pertinent.  Nuc 10/2013 Impression Exercise Capacity:  Lexiscan with low level exercise. BP Response:  Normal blood pressure response. Clinical Symptoms:  No chest pain. ECG Impression:  No significant ST segment change suggestive of ischemia. Comparison with Prior Nuclear Study: No images to compare   Overall Impression:  Normal stress nuclear study.   LV Ejection Fraction: 77%.  LV Wall Motion:  NL LV Function; NL Wall Motion   Darlin Coco MD      EKG:  EKG is ordered today, personally reviewed, demonstrating NSR 68bpm, nonspecific TW changes inferiorly, QTc 467ms  Recent Labs: No results found for requested labs within last 8760 hours.  Recent Lipid Panel No results found for: CHOL, TRIG, HDL, CHOLHDL, VLDL, LDLCALC, LDLDIRECT  PHYSICAL EXAM:    VS:  BP 130/70   Pulse 68   Ht 5\' 2"  (1.575 m)   Wt 101 lb 6.4 oz (46 kg)   SpO2 98%   BMI 18.55 kg/m   BMI: Body mass index is 18.55 kg/m.  GEN: Thin white female in no acute distress HEENT: normocephalic, atraumatic Neck: no JVD, carotid bruits, or masses Cardiac: RRR; no murmurs, rubs, or gallops, no edema  Respiratory:  clear to auscultation bilaterally, normal work of breathing GI: soft, nontender, nondistended, + BS MS: no deformity or atrophy Skin: warm and dry, no rash Neuro:  Alert and Oriented x 3,  Strength and sensation are intact, follows commands Psych: euthymic mood, full affect  Wt Readings from Last 3 Encounters:  12/11/20 101 lb 6.4 oz (46 kg)  10/10/19 112 lb (50.8 kg)  09/06/18 112 lb 14 oz (51.2 kg)     ASSESSMENT & PLAN:   1. Prior history of chest pain and lower extremity edema - neither of these issues are active. She is no longer requiring diuretic therapy for edema. No recurrent chest pain. In the absence of symptoms, no further testing is indicated, but she will notify for any clinical changes. We will see if we can get a paper copy of her recent CBC and CMET for our records as [facility] only has a few of the labs listed.  2. Essential HTN - BP controlled off therapy likely due to weight loss. Continue ongoing f/u with GI, PCP.  3. Aortic atherosclerosis -  we discussed this finding today. We do not necessarily have to act on this finding other than to be cognizant of risk factor modification. See below regarding consideration of statin therapy.  4. Hyperlipidemia LDL goal <70 - she would like to recheck her cholesterol today. She had a healthy breakfast much earlier this morning but has not had lunch yet. We discussed possibility of addition of statin therapy for prevention. She is of advanced age but otherwise in fairly good health so I do not see any explicit contraindications or polypharmacy. Would otherwise start low dose if indicated, no aggressive dosing.  Disposition: F/u with Dr. Marlou Porch in 1 year.   Medication Adjustments/Labs and Tests Ordered: Current medicines are reviewed at length with the patient today.  Concerns regarding medicines are outlined above. Medication changes, Labs and Tests ordered today are summarized above and listed in the Patient Instructions accessible in Encounters.   Signed, Charlie Pitter, PA-C  12/11/2020 3:37 PM    [facility] [address], [place], [place]  [address] Phone: [phone]; Fax: [phone]

## 2020-12-11 ENCOUNTER — Other Ambulatory Visit: Payer: Self-pay

## 2020-12-11 ENCOUNTER — Ambulatory Visit (INDEPENDENT_AMBULATORY_CARE_PROVIDER_SITE_OTHER): Payer: Medicare Other | Admitting: Physician Assistant

## 2020-12-11 ENCOUNTER — Encounter: Payer: Self-pay | Admitting: Physician Assistant

## 2020-12-11 VITALS — BP 130/70 | HR 68 | Ht 62.0 in | Wt 101.4 lb

## 2020-12-11 DIAGNOSIS — Z87898 Personal history of other specified conditions: Secondary | ICD-10-CM

## 2020-12-11 DIAGNOSIS — E785 Hyperlipidemia, unspecified: Secondary | ICD-10-CM

## 2020-12-11 DIAGNOSIS — I700 Atherosclerosis of aorta: Secondary | ICD-10-CM

## 2020-12-11 DIAGNOSIS — I10 Essential (primary) hypertension: Secondary | ICD-10-CM | POA: Diagnosis not present

## 2020-12-11 DIAGNOSIS — R600 Localized edema: Secondary | ICD-10-CM

## 2020-12-11 NOTE — Patient Instructions (Signed)
Medication Instructions:  Your physician recommends that you continue on your current medications as directed. Please refer to the Current Medication list given to you today.  *If you need a refill on your cardiac medications before your next appointment, please call your pharmacy*   Lab Work: TODAY:  LIPID  If you have labs (blood work) drawn today and your tests are completely normal, you will receive your results only by: [place] (if you have MyChart) OR A paper copy in the mail If you have any lab test that is abnormal or we need to change your treatment, we will call you to review the results.   Testing/Procedures: None ordered   Follow-Up: At [facility], you and your health needs are our priority.  As part of our continuing mission to provide you with exceptional heart care, we have created designated Provider Care Teams.  These Care Teams include your primary Cardiologist (physician) and Advanced Practice Providers (APPs -  Physician Assistants and Nurse Practitioners) who all work together to provide you with the care you need, when you need it.  We recommend signing up for the patient portal called "MyChart".  Sign up information is provided on this After Visit Summary.  MyChart is used to connect with patients for Virtual Visits (Telemedicine).  Patients are able to view lab/test results, encounter notes, upcoming appointments, etc.  Non-urgent messages can be sent to your provider as well.   To learn more about what you can do with MyChart, go to [email or web address].    Your next appointment:   1 year(s)  The format for your next appointment:   In Person  Provider:   Candee Furbish, MD     Other Instructions

## 2020-12-12 LAB — LIPID PANEL
Chol/HDL Ratio: 3.1 ratio (ref 0.0–4.4)
Cholesterol, Total: 170 mg/dL (ref 100–199)
HDL: 55 mg/dL (ref >39)
LDL Chol Calc (NIH): 100 mg/dL — ABNORMAL HIGH (ref 0–99)
Triglycerides: 79 mg/dL (ref 0–149)
VLDL Cholesterol Cal: 15 mg/dL (ref 5–40)

## 2020-12-15 DIAGNOSIS — K219 Gastro-esophageal reflux disease without esophagitis: Secondary | ICD-10-CM | POA: Diagnosis not present

## 2020-12-15 DIAGNOSIS — I10 Essential (primary) hypertension: Secondary | ICD-10-CM | POA: Diagnosis not present

## 2020-12-15 DIAGNOSIS — F4321 Adjustment disorder with depressed mood: Secondary | ICD-10-CM | POA: Diagnosis not present

## 2020-12-15 DIAGNOSIS — E039 Hypothyroidism, unspecified: Secondary | ICD-10-CM | POA: Diagnosis not present

## 2020-12-15 DIAGNOSIS — M8580 Other specified disorders of bone density and structure, unspecified site: Secondary | ICD-10-CM | POA: Diagnosis not present

## 2020-12-15 DIAGNOSIS — E1169 Type 2 diabetes mellitus with other specified complication: Secondary | ICD-10-CM | POA: Diagnosis not present

## 2020-12-15 DIAGNOSIS — F325 Major depressive disorder, single episode, in full remission: Secondary | ICD-10-CM | POA: Diagnosis not present

## 2020-12-15 DIAGNOSIS — G8929 Other chronic pain: Secondary | ICD-10-CM | POA: Diagnosis not present

## 2020-12-31 ENCOUNTER — Ambulatory Visit: Payer: Medicare Other | Admitting: Podiatry

## 2021-01-06 DIAGNOSIS — K9049 Malabsorption due to intolerance, not elsewhere classified: Secondary | ICD-10-CM | POA: Diagnosis not present

## 2021-01-06 DIAGNOSIS — D509 Iron deficiency anemia, unspecified: Secondary | ICD-10-CM | POA: Diagnosis not present

## 2021-01-11 ENCOUNTER — Ambulatory Visit: Payer: Medicare Other | Admitting: Podiatry

## 2021-01-18 DIAGNOSIS — R42 Dizziness and giddiness: Secondary | ICD-10-CM | POA: Diagnosis not present

## 2021-01-18 DIAGNOSIS — D509 Iron deficiency anemia, unspecified: Secondary | ICD-10-CM | POA: Diagnosis not present

## 2021-01-18 DIAGNOSIS — R5383 Other fatigue: Secondary | ICD-10-CM | POA: Diagnosis not present

## 2021-01-18 DIAGNOSIS — R197 Diarrhea, unspecified: Secondary | ICD-10-CM | POA: Diagnosis not present

## 2021-01-18 DIAGNOSIS — Z79899 Other long term (current) drug therapy: Secondary | ICD-10-CM | POA: Diagnosis not present

## 2021-01-18 DIAGNOSIS — R109 Unspecified abdominal pain: Secondary | ICD-10-CM | POA: Diagnosis not present

## 2021-01-18 DIAGNOSIS — I10 Essential (primary) hypertension: Secondary | ICD-10-CM | POA: Diagnosis not present

## 2021-01-18 DIAGNOSIS — D649 Anemia, unspecified: Secondary | ICD-10-CM | POA: Diagnosis not present

## 2021-01-26 DIAGNOSIS — R197 Diarrhea, unspecified: Secondary | ICD-10-CM | POA: Diagnosis not present

## 2021-01-26 DIAGNOSIS — I10 Essential (primary) hypertension: Secondary | ICD-10-CM | POA: Diagnosis not present

## 2021-01-26 DIAGNOSIS — D509 Iron deficiency anemia, unspecified: Secondary | ICD-10-CM | POA: Diagnosis not present

## 2021-01-26 DIAGNOSIS — J309 Allergic rhinitis, unspecified: Secondary | ICD-10-CM | POA: Diagnosis not present

## 2021-02-12 DIAGNOSIS — K8681 Exocrine pancreatic insufficiency: Secondary | ICD-10-CM | POA: Diagnosis not present

## 2021-02-12 DIAGNOSIS — C259 Malignant neoplasm of pancreas, unspecified: Secondary | ICD-10-CM | POA: Diagnosis not present

## 2021-02-15 DIAGNOSIS — C259 Malignant neoplasm of pancreas, unspecified: Secondary | ICD-10-CM | POA: Diagnosis not present

## 2021-02-15 DIAGNOSIS — K8681 Exocrine pancreatic insufficiency: Secondary | ICD-10-CM | POA: Diagnosis not present

## 2021-02-16 DIAGNOSIS — K8681 Exocrine pancreatic insufficiency: Secondary | ICD-10-CM | POA: Diagnosis not present

## 2021-02-16 DIAGNOSIS — C259 Malignant neoplasm of pancreas, unspecified: Secondary | ICD-10-CM | POA: Diagnosis not present

## 2021-02-23 ENCOUNTER — Ambulatory Visit: Payer: Medicare Other | Admitting: Podiatry

## 2021-03-01 DIAGNOSIS — M5416 Radiculopathy, lumbar region: Secondary | ICD-10-CM | POA: Diagnosis not present

## 2021-03-01 DIAGNOSIS — M5117 Intervertebral disc disorders with radiculopathy, lumbosacral region: Secondary | ICD-10-CM | POA: Diagnosis not present

## 2021-03-08 ENCOUNTER — Other Ambulatory Visit: Payer: Self-pay

## 2021-03-08 ENCOUNTER — Ambulatory Visit (INDEPENDENT_AMBULATORY_CARE_PROVIDER_SITE_OTHER): Payer: Medicare Other | Admitting: Podiatry

## 2021-03-08 DIAGNOSIS — L84 Corns and callosities: Secondary | ICD-10-CM

## 2021-03-08 DIAGNOSIS — B351 Tinea unguium: Secondary | ICD-10-CM | POA: Diagnosis not present

## 2021-03-11 NOTE — Progress Notes (Signed)
Subjective: 88 year old female presents the office today for concerns of painful calluses mostly in her left foot.  Denies any swelling or redness of the callus sites but the areas are tender.  Also she does get some thickening to her nails mostly her left big toenail.  No swelling redness of the toenail sites.  No other concerns.  Objective: AAO x3, NAD DP/PT pulses palpable bilaterally, CRT less than 3 seconds Hyperkeratotic lesion on the medial aspect left second toe and upon debridement there is no underlying ulceration drainage or any signs of infection.  Bunion, hammertoe deformities are noted.  Prominence of metatarsal heads plantarly with atrophy of the fat pad.   Nails are mildly hypertrophic, dystrophic most of the left hallux toenail.  Some mild bruising present in the nail but there is no extension of any hyperpigmentation of the surrounding skin.  No edema, erythema or signs of infection.  No open lesions.  Assessment: 88 year old female with hyperkeratotic lesion left foot, metatarsalgia  Plan: -All treatment options discussed with the patient including all alternatives, risks, complications.  -Debrided hyperkeratotic lesion x1 without any complications or bleeding.  Continue offloading.  Discussed likely reoccurrence given the digital deformity. -Nails with any complications or bleeding.  Discussed treatment options for the likely fungus. -Patient encouraged to call the office with any questions, concerns, change in symptoms.   Trula Slade DPM

## 2021-03-19 DIAGNOSIS — R634 Abnormal weight loss: Secondary | ICD-10-CM | POA: Diagnosis not present

## 2021-03-19 DIAGNOSIS — K8681 Exocrine pancreatic insufficiency: Secondary | ICD-10-CM | POA: Diagnosis not present

## 2021-03-19 DIAGNOSIS — C250 Malignant neoplasm of head of pancreas: Secondary | ICD-10-CM | POA: Diagnosis not present

## 2021-04-15 DIAGNOSIS — Z0000 Encounter for general adult medical examination without abnormal findings: Secondary | ICD-10-CM | POA: Diagnosis not present

## 2021-04-16 DIAGNOSIS — I10 Essential (primary) hypertension: Secondary | ICD-10-CM | POA: Diagnosis not present

## 2021-04-16 DIAGNOSIS — Z79899 Other long term (current) drug therapy: Secondary | ICD-10-CM | POA: Diagnosis not present

## 2021-04-16 DIAGNOSIS — R7301 Impaired fasting glucose: Secondary | ICD-10-CM | POA: Diagnosis not present

## 2021-04-16 DIAGNOSIS — R5383 Other fatigue: Secondary | ICD-10-CM | POA: Diagnosis not present

## 2021-04-16 DIAGNOSIS — D500 Iron deficiency anemia secondary to blood loss (chronic): Secondary | ICD-10-CM | POA: Diagnosis not present

## 2021-04-21 ENCOUNTER — Telehealth: Payer: Self-pay | Admitting: Cardiology

## 2021-04-21 NOTE — Telephone Encounter (Signed)
Pt c/o BP issue: STAT if pt c/o blurred vision, one-sided weakness or slurred speech ? ?1. What are your last 5 BP readings? 126/56; 130/58; 133/64 ? ?2. Are you having any other symptoms (ex. Dizziness, headache, blurred vision, passed out)? Dizziness, lightheaded, off-balanced, close to passing out ? ?3. What is your BP issue? Patient is suffering from Hypotension with low BP.   ?

## 2021-04-21 NOTE — Telephone Encounter (Signed)
Left a message to call back.

## 2021-04-22 DIAGNOSIS — R112 Nausea with vomiting, unspecified: Secondary | ICD-10-CM | POA: Diagnosis not present

## 2021-04-22 NOTE — Telephone Encounter (Signed)
Spoke with pt who asked me to speak with her daughter Darol Destine.  Per daughter BP has been concerning yesterday 150/51 - today 127/55.  Pt has been "sick on her stomach and vomiting" today.  Yesterday she c/o pain above her eye and in her temple.  Pt has been having some lightheadedness off/on for approximately 1 week.  Daughter reports Dr Felipa Eth had her on Lisinopril 20 mg a day in the past but her new PCP has decreased that to 5 mg a day. She also takes Lexapro 5 mg daily.  Medication list updated.  Pt has a video visit with her PCP this afternoon and will  discuss her BP and current s/s at that time.  Advised to call back if further needs from this office.   ?

## 2021-04-22 NOTE — Telephone Encounter (Signed)
Katrina Pain, MD  You 3 minutes ago (4:37 PM)  ? ?Agree with her continuing to follow-up with her PCP who has made some medication management changes with her blood pressure medications.  Thank you for the update.   ? ?

## 2021-04-23 DIAGNOSIS — R1111 Vomiting without nausea: Secondary | ICD-10-CM | POA: Diagnosis not present

## 2021-04-23 DIAGNOSIS — R197 Diarrhea, unspecified: Secondary | ICD-10-CM | POA: Diagnosis not present

## 2021-04-23 DIAGNOSIS — E860 Dehydration: Secondary | ICD-10-CM | POA: Diagnosis not present

## 2021-04-23 DIAGNOSIS — I959 Hypotension, unspecified: Secondary | ICD-10-CM | POA: Diagnosis not present

## 2021-04-23 DIAGNOSIS — R42 Dizziness and giddiness: Secondary | ICD-10-CM | POA: Diagnosis not present

## 2021-04-29 DIAGNOSIS — R42 Dizziness and giddiness: Secondary | ICD-10-CM | POA: Diagnosis not present

## 2021-04-29 DIAGNOSIS — R7301 Impaired fasting glucose: Secondary | ICD-10-CM | POA: Diagnosis not present

## 2021-04-29 DIAGNOSIS — D509 Iron deficiency anemia, unspecified: Secondary | ICD-10-CM | POA: Diagnosis not present

## 2021-04-29 DIAGNOSIS — G3184 Mild cognitive impairment, so stated: Secondary | ICD-10-CM | POA: Diagnosis not present

## 2021-04-29 DIAGNOSIS — I10 Essential (primary) hypertension: Secondary | ICD-10-CM | POA: Diagnosis not present

## 2021-04-29 DIAGNOSIS — R197 Diarrhea, unspecified: Secondary | ICD-10-CM | POA: Diagnosis not present

## 2021-04-30 DIAGNOSIS — R197 Diarrhea, unspecified: Secondary | ICD-10-CM | POA: Diagnosis not present

## 2021-04-30 DIAGNOSIS — C259 Malignant neoplasm of pancreas, unspecified: Secondary | ICD-10-CM | POA: Diagnosis not present

## 2021-04-30 DIAGNOSIS — K8681 Exocrine pancreatic insufficiency: Secondary | ICD-10-CM | POA: Diagnosis not present

## 2021-05-03 DIAGNOSIS — R42 Dizziness and giddiness: Secondary | ICD-10-CM | POA: Diagnosis not present

## 2021-05-04 DIAGNOSIS — M542 Cervicalgia: Secondary | ICD-10-CM | POA: Diagnosis not present

## 2021-05-04 DIAGNOSIS — R519 Headache, unspecified: Secondary | ICD-10-CM | POA: Diagnosis not present

## 2021-05-04 DIAGNOSIS — S0990XA Unspecified injury of head, initial encounter: Secondary | ICD-10-CM | POA: Diagnosis not present

## 2021-05-04 DIAGNOSIS — S161XXA Strain of muscle, fascia and tendon at neck level, initial encounter: Secondary | ICD-10-CM | POA: Diagnosis not present

## 2021-05-04 DIAGNOSIS — Z859 Personal history of malignant neoplasm, unspecified: Secondary | ICD-10-CM | POA: Diagnosis not present

## 2021-05-04 DIAGNOSIS — I10 Essential (primary) hypertension: Secondary | ICD-10-CM | POA: Diagnosis not present

## 2021-05-04 DIAGNOSIS — Z888 Allergy status to other drugs, medicaments and biological substances status: Secondary | ICD-10-CM | POA: Diagnosis not present

## 2021-05-04 DIAGNOSIS — N390 Urinary tract infection, site not specified: Secondary | ICD-10-CM | POA: Diagnosis not present

## 2021-05-04 DIAGNOSIS — E119 Type 2 diabetes mellitus without complications: Secondary | ICD-10-CM | POA: Diagnosis not present

## 2021-05-10 DIAGNOSIS — R8271 Bacteriuria: Secondary | ICD-10-CM | POA: Diagnosis not present

## 2021-05-10 DIAGNOSIS — N3941 Urge incontinence: Secondary | ICD-10-CM | POA: Diagnosis not present

## 2021-05-18 DIAGNOSIS — R42 Dizziness and giddiness: Secondary | ICD-10-CM | POA: Diagnosis not present

## 2021-05-18 DIAGNOSIS — N390 Urinary tract infection, site not specified: Secondary | ICD-10-CM | POA: Diagnosis not present

## 2021-05-18 DIAGNOSIS — I10 Essential (primary) hypertension: Secondary | ICD-10-CM | POA: Diagnosis not present

## 2021-05-18 DIAGNOSIS — R0982 Postnasal drip: Secondary | ICD-10-CM | POA: Diagnosis not present

## 2021-05-20 DIAGNOSIS — R413 Other amnesia: Secondary | ICD-10-CM | POA: Diagnosis not present

## 2021-05-20 DIAGNOSIS — D649 Anemia, unspecified: Secondary | ICD-10-CM | POA: Diagnosis not present

## 2021-05-20 DIAGNOSIS — E559 Vitamin D deficiency, unspecified: Secondary | ICD-10-CM | POA: Diagnosis not present

## 2021-05-20 DIAGNOSIS — D519 Vitamin B12 deficiency anemia, unspecified: Secondary | ICD-10-CM | POA: Diagnosis not present

## 2021-05-28 DIAGNOSIS — R2689 Other abnormalities of gait and mobility: Secondary | ICD-10-CM | POA: Diagnosis not present

## 2021-05-30 DIAGNOSIS — I10 Essential (primary) hypertension: Secondary | ICD-10-CM | POA: Diagnosis not present

## 2021-05-30 DIAGNOSIS — E119 Type 2 diabetes mellitus without complications: Secondary | ICD-10-CM | POA: Diagnosis not present

## 2021-05-30 DIAGNOSIS — K219 Gastro-esophageal reflux disease without esophagitis: Secondary | ICD-10-CM | POA: Diagnosis not present

## 2021-06-02 DIAGNOSIS — S060X0A Concussion without loss of consciousness, initial encounter: Secondary | ICD-10-CM | POA: Diagnosis not present

## 2021-06-23 DIAGNOSIS — R413 Other amnesia: Secondary | ICD-10-CM | POA: Diagnosis not present

## 2021-06-30 DIAGNOSIS — I10 Essential (primary) hypertension: Secondary | ICD-10-CM | POA: Diagnosis not present

## 2021-06-30 DIAGNOSIS — K219 Gastro-esophageal reflux disease without esophagitis: Secondary | ICD-10-CM | POA: Diagnosis not present

## 2021-06-30 DIAGNOSIS — E119 Type 2 diabetes mellitus without complications: Secondary | ICD-10-CM | POA: Diagnosis not present

## 2021-07-01 DIAGNOSIS — R12 Heartburn: Secondary | ICD-10-CM | POA: Diagnosis not present

## 2021-07-01 DIAGNOSIS — K8681 Exocrine pancreatic insufficiency: Secondary | ICD-10-CM | POA: Diagnosis not present

## 2021-07-07 DIAGNOSIS — K219 Gastro-esophageal reflux disease without esophagitis: Secondary | ICD-10-CM | POA: Diagnosis not present

## 2021-07-07 DIAGNOSIS — J310 Chronic rhinitis: Secondary | ICD-10-CM | POA: Diagnosis not present

## 2021-07-07 DIAGNOSIS — R490 Dysphonia: Secondary | ICD-10-CM | POA: Diagnosis not present

## 2021-07-09 DIAGNOSIS — M5030 Other cervical disc degeneration, unspecified cervical region: Secondary | ICD-10-CM | POA: Diagnosis not present

## 2021-07-09 DIAGNOSIS — Z681 Body mass index (BMI) 19 or less, adult: Secondary | ICD-10-CM | POA: Diagnosis not present

## 2021-07-09 DIAGNOSIS — M542 Cervicalgia: Secondary | ICD-10-CM | POA: Diagnosis not present

## 2021-07-09 DIAGNOSIS — M7918 Myalgia, other site: Secondary | ICD-10-CM | POA: Diagnosis not present

## 2021-09-06 ENCOUNTER — Ambulatory Visit: Payer: Medicare Other | Admitting: Podiatry
